# Patient Record
Sex: Female | Born: 1970
Health system: Southern US, Community
[De-identification: ages and names within clinical notes are randomized; demographics above are authoritative.]

## PROBLEM LIST (undated history)

## (undated) DIAGNOSIS — J189 Pneumonia, unspecified organism: Secondary | ICD-10-CM

## (undated) DIAGNOSIS — R32 Unspecified urinary incontinence: Secondary | ICD-10-CM

## (undated) DIAGNOSIS — Z5189 Encounter for other specified aftercare: Secondary | ICD-10-CM

## (undated) DIAGNOSIS — Z9889 Other specified postprocedural states: Secondary | ICD-10-CM

## (undated) DIAGNOSIS — R011 Cardiac murmur, unspecified: Secondary | ICD-10-CM

## (undated) DIAGNOSIS — R87619 Unspecified abnormal cytological findings in specimens from cervix uteri: Secondary | ICD-10-CM

## (undated) DIAGNOSIS — N939 Abnormal uterine and vaginal bleeding, unspecified: Secondary | ICD-10-CM

## (undated) DIAGNOSIS — Z973 Presence of spectacles and contact lenses: Secondary | ICD-10-CM

## (undated) DIAGNOSIS — R112 Nausea with vomiting, unspecified: Secondary | ICD-10-CM

## (undated) DIAGNOSIS — T7840XA Allergy, unspecified, initial encounter: Secondary | ICD-10-CM

## (undated) HISTORY — DX: Cardiac murmur, unspecified: R01.1

## (undated) HISTORY — PX: DILATION AND CURETTAGE OF UTERUS: SHX78

## (undated) HISTORY — DX: Unspecified abnormal cytological findings in specimens from cervix uteri: R87.619

## (undated) HISTORY — DX: Allergy, unspecified, initial encounter: T78.40XA

## (undated) HISTORY — DX: Abnormal uterine and vaginal bleeding, unspecified: N93.9

## (undated) HISTORY — PX: REDUCTION MAMMAPLASTY: SUR839

## (undated) HISTORY — DX: Unspecified urinary incontinence: R32

## (undated) HISTORY — DX: Encounter for other specified aftercare: Z51.89

---

## 1993-08-28 DIAGNOSIS — R87619 Unspecified abnormal cytological findings in specimens from cervix uteri: Secondary | ICD-10-CM

## 1993-08-28 HISTORY — DX: Unspecified abnormal cytological findings in specimens from cervix uteri: R87.619

## 1999-08-08 ENCOUNTER — Other Ambulatory Visit: Admission: RE | Admit: 1999-08-08 | Discharge: 1999-08-08 | Payer: Self-pay | Admitting: Obstetrics and Gynecology

## 1999-10-28 ENCOUNTER — Ambulatory Visit (HOSPITAL_COMMUNITY): Admission: RE | Admit: 1999-10-28 | Discharge: 1999-10-28 | Payer: Self-pay | Admitting: Pulmonary Disease

## 1999-10-28 ENCOUNTER — Encounter: Payer: Self-pay | Admitting: Pulmonary Disease

## 2000-08-11 ENCOUNTER — Emergency Department (HOSPITAL_COMMUNITY): Admission: EM | Admit: 2000-08-11 | Discharge: 2000-08-11 | Payer: Self-pay | Admitting: Emergency Medicine

## 2000-09-05 ENCOUNTER — Ambulatory Visit (HOSPITAL_COMMUNITY): Admission: RE | Admit: 2000-09-05 | Discharge: 2000-09-05 | Payer: Self-pay | Admitting: Obstetrics & Gynecology

## 2000-11-23 ENCOUNTER — Inpatient Hospital Stay (HOSPITAL_COMMUNITY): Admission: AD | Admit: 2000-11-23 | Discharge: 2000-11-25 | Payer: Self-pay | Admitting: Obstetrics and Gynecology

## 2000-12-21 ENCOUNTER — Other Ambulatory Visit: Admission: RE | Admit: 2000-12-21 | Discharge: 2000-12-21 | Payer: Self-pay | Admitting: Obstetrics and Gynecology

## 2001-07-04 ENCOUNTER — Encounter: Payer: Self-pay | Admitting: Pulmonary Disease

## 2001-07-04 ENCOUNTER — Ambulatory Visit (HOSPITAL_COMMUNITY): Admission: RE | Admit: 2001-07-04 | Discharge: 2001-07-04 | Payer: Self-pay | Admitting: Pulmonary Disease

## 2001-12-05 ENCOUNTER — Other Ambulatory Visit: Admission: RE | Admit: 2001-12-05 | Discharge: 2001-12-05 | Payer: Self-pay | Admitting: Obstetrics and Gynecology

## 2004-06-01 ENCOUNTER — Other Ambulatory Visit: Admission: RE | Admit: 2004-06-01 | Discharge: 2004-06-01 | Payer: Self-pay | Admitting: Obstetrics and Gynecology

## 2005-03-30 ENCOUNTER — Ambulatory Visit: Payer: Self-pay | Admitting: Pulmonary Disease

## 2007-08-29 HISTORY — PX: ENDOMETRIAL ABLATION: SHX621

## 2007-12-17 ENCOUNTER — Encounter: Admission: RE | Admit: 2007-12-17 | Discharge: 2008-03-16 | Payer: Self-pay | Admitting: Urology

## 2008-08-06 ENCOUNTER — Ambulatory Visit (HOSPITAL_COMMUNITY): Admission: RE | Admit: 2008-08-06 | Discharge: 2008-08-07 | Payer: Self-pay | Admitting: Obstetrics and Gynecology

## 2008-08-06 ENCOUNTER — Encounter (INDEPENDENT_AMBULATORY_CARE_PROVIDER_SITE_OTHER): Payer: Self-pay | Admitting: Obstetrics and Gynecology

## 2010-09-27 ENCOUNTER — Encounter: Payer: Self-pay | Admitting: Family Medicine

## 2010-11-09 ENCOUNTER — Encounter: Payer: Self-pay | Admitting: Family Medicine

## 2010-11-11 ENCOUNTER — Encounter: Payer: Self-pay | Admitting: Family Medicine

## 2010-11-15 NOTE — Assessment & Plan Note (Signed)
Summary: Urinary Symptoms  Complains of mild recurrent dysuria.  No fever or abdominal pain  Plan:  Repeat culture CIPROFLOXACIN HCL 500 MG TABS (CIPROFLOXACIN HCL) 1 by mouth q12hr 11/09/10 @ 12:00pm called in RX to [facility]. pt notified. C.Locklear,LPN  Prescriptions: CIPROFLOXACIN HCL 500 MG TABS (CIPROFLOXACIN HCL) 1 by mouth q12hr  #20 x 0   Entered and Authorized by:   Donna Christen MD   Signed by:   Donna Christen MD on 11/09/2010   Method used:   Telephoned to ...         RxID:   [id number]

## 2011-01-10 NOTE — Op Note (Signed)
Regina Mcconnell, Regina Mcconnell                  ACCOUNT NO.:  [id number]   MEDICAL RECORD NO.:  [id number]          PATIENT TYPE:  AMB   LOCATION:  SDC                           FACILITY:  [facility]   PHYSICIAN:  Randye Lobo, M.D.   DATE OF BIRTH:  May 16, 1971   DATE OF PROCEDURE:  DATE OF DISCHARGE:                               OPERATIVE REPORT   PREOPERATIVE DIAGNOSES:  1. Genuine stress incontinence.  2. Menometrorrhagia.  3. Incomplete vaginal prolapse.   POSTOPERATIVE DIAGNOSES:  1. Genuine stress incontinence.  2. Menometrorrhagia.  3. Incomplete vaginal prolapse.  4. Duplication of left ureter.   PROCEDURES:  Diagnostic hysteroscopy, dilation and curettage, NovaSure  endometrial ablation, tension-free vaginal tape mid urethral sling,  cystoscopy, anterior colporrhaphy.   SURGEON:  Randye Lobo, MD   ASSISTANT:  Miguel Aschoff, MD   ANESTHESIA:  General endotracheal, paracervical block with 1% lidocaine,  local vaginal injection of 0.5% lidocaine with 1:200,000 of epinephrine.   IV FLUIDS:  1000 mL Ringer's lactate.   ESTIMATED BLOOD LOSS:  75 mL.   URINE OUTPUT:  325 mL   COMPLICATIONS:  None.   INDICATIONS FOR PROCEDURE:  The patient is a 40 year old para 1,  Caucasian female who presents with leakage of urine with coughing,  sneezing, and laughing.  The patient has done physical therapy which has  not adequately improved her urinary incontinence.  The patient is also  having frequent and heavy menses.  She has undergone an office  ultrasound which was normal.  An endometrial biopsy was benign.  The  patient did have a trial of oral contraceptive therapy and continued to  have bleeding with this.  She declines further medical therapy and she  wishes to proceed with an endometrial ablation procedure and a mid  urethral sling and cystoscopy.  On exam, she is noted to have a second-  degree cystocele and this will be repaired as well.  A plan is now made  to proceed with a  hysteroscopy D and C, NovaSure endometrial ablation,  TVT sling, cystoscopy, and anterior colporrhaphy after risks, benefits,  and alternatives are reviewed.   FINDINGS:  The hysteroscopy demonstrated a normal endometrial cavity  with no evidence of any polyps or fibroids.  Both ends of tubal ostia  regions were identified and were noted to be unremarkable.  There was no  lesions within the cervix.   Cystoscopy demonstrated duplication of the ureter on the left-hand side.  There was a single ureter on the right.  All of the ureters were noted  to be patent after the injection of indigo carmine dye IV.  The bladder  was visualized about 360 degrees including the bladder dome and trigone  and there was no evidence of a foreign body nor any lesions in either  the bladder or the urethra.   SPECIMENS:  Endometrial curettings were sent to pathology.   PROCEDURE:  The patient was reidentified in the preoperative holding  area.  She did receive Cipro 400 mg IV for antibiotic prophylaxis.  She  received PAS stockings for DVT prophylaxis.  In the operating room, general endotracheal anesthesia was induced.  The  patient was then placed in the dorsal lithotomy position.  The lower  abdomen and vagina were sterilely prepped and draped and a Foley  catheter was placed inside the bladder.   An exam under anesthesia was performed.   A speculum was placed inside the vagina and a single-tooth tenaculum was  placed on the anterior cervical lip.  A paracervical block was performed  with a total of 10 mL of 1% lidocaine.   The endocervical length was sounded to 2.5 cm.  The cavity length was  then determined to be 8 cm.  The diagnostic hysteroscope was inserted  into the uterine cavity under the continuous infusion of hysteroscopic  fluid.  The findings are as noted above.  The hysteroscope was withdrawn  and the cervix was so dilated up to a #29 Pratt dilator.  The  endometrial cavity was then  curetted in all four regions and the tissue  was sent to pathology.   The NovaSure device was then placed inside the uterine cavity and the  cavity width was measured at 2.7 cm.  The obturator was placed against  the cervix and the CO2 test was performed, and the patient passed.  The  NovaSure device was then used for a power setting of 82 and a total  treat time of 1 minute and 50 seconds.  The device was removed without  difficulty and a single-tooth tenaculum was removed from the anterior  cervical lip.   A weighted speculum was then placed inside the vagina and Allis clamps  were placed along the anterior vaginal mucosa.  The mucosa was then  injected with 0.5% lidocaine with 1:200,000 of epinephrine.  The vaginal  mucosa was incised in the midline with a scalpel and a combination of  sharp and blunt dissection were used to dissect the subvaginal tissue  and bladder off of the overlying vaginal mucosa bilaterally.  The  dissection was carried to the level of the pubic rami and down to the  level of the cervix.  Hemostasis was created with monopolar cautery.   Two 1 cm incisions were then created suprapubically 2 cm to the right  and left to the midline.  The TVT was performed in a top-down fashion.  The abdominal needle passer was placed through the right suprapubic  incision and out through the endopelvic fascia in the vagina on the  ipsilateral side.  The same procedure that was performed on the right-  hand side was repeated on the left-hand side.   The Foley catheter was removed and cystoscopy was performed in the  findings as noted above.  The cystoscopic fluid was drained and the  Foley catheter was replaced.  The sling was attached to the abdominal  needle passers and drawn up through the suprapubic incisions.  Final  tension on the sling was adjusted and the plastic sheaths were then  removed as a Kelly clamp was placed between the sling and the urethra.  Excess sling was  trimmed suprapubically.  The sling was noted to be in  excellent position.   The anterior colporrhaphy was performed next well by placing vertical  mattress sutures of 2-0 Vicryl.  Excess vaginal mucosa was trimmed in  the anterior vaginal wall, was closed with a running lock suture of 2-0  Vicryl.  Hemostasis was good.  The vaginal packing of plain gauze was  placed inside the vagina.   The suprapubic  incisions were closed with Dermabond.   This concluded the patient's procedure.  There were no complications.  All needle, instrument, and sponge counts were correct.      Randye Lobo, M.D.  Electronically Signed     BES/MEDQ  D:  08/06/2008  T:  08/07/2008  Job:  [id number]

## 2011-06-01 LAB — CBC
HCT: 40.8 % (ref 36.0–46.0)
Hemoglobin: 13.6 g/dL (ref 12.0–15.0)
MCHC: 33.3 g/dL (ref 30.0–36.0)
MCHC: 33.8 g/dL (ref 30.0–36.0)
MCV: 84.1 fL (ref 78.0–100.0)
Platelets: 326 10*3/uL (ref 150–400)
RBC: 3.94 MIL/uL (ref 3.87–5.11)
RBC: 4.85 MIL/uL (ref 3.87–5.11)
RDW: 13.6 % (ref 11.5–15.5)
WBC: 8.5 10*3/uL (ref 4.0–10.5)

## 2011-06-01 LAB — BASIC METABOLIC PANEL
BUN: 13 mg/dL (ref 6–23)
CO2: 26 mEq/L (ref 19–32)
CO2: 27 mEq/L (ref 19–32)
Calcium: 8.2 mg/dL — ABNORMAL LOW (ref 8.4–10.5)
Calcium: 9.3 mg/dL (ref 8.4–10.5)
Chloride: 106 mEq/L (ref 96–112)
Creatinine, Ser: 0.7 mg/dL (ref 0.4–1.2)
Creatinine, Ser: 0.77 mg/dL (ref 0.4–1.2)
GFR calc Af Amer: >60 mL/min (ref >60)
GFR calc Af Amer: >60 mL/min (ref >60)
GFR calc non Af Amer: >60 mL/min (ref >60)
Glucose, Bld: 99 mg/dL (ref 70–99)
Potassium: 4.2 mEq/L (ref 3.5–5.1)
Sodium: 139 mEq/L (ref 135–145)

## 2011-06-01 LAB — URINALYSIS, ROUTINE W REFLEX MICROSCOPIC
Hgb urine dipstick: NEGATIVE
Nitrite: NEGATIVE
Protein, ur: NEGATIVE mg/dL
Urobilinogen, UA: 0.2 mg/dL (ref 0.0–1.0)

## 2011-06-01 LAB — PREGNANCY, URINE: Preg Test, Ur: NEGATIVE

## 2012-03-05 ENCOUNTER — Encounter: Payer: Self-pay | Admitting: Physician Assistant

## 2012-03-06 ENCOUNTER — Encounter: Payer: Self-pay | Admitting: Physician Assistant

## 2012-06-03 ENCOUNTER — Encounter: Payer: Self-pay | Admitting: Sports Medicine

## 2012-06-03 ENCOUNTER — Ambulatory Visit (INDEPENDENT_AMBULATORY_CARE_PROVIDER_SITE_OTHER): Payer: 59 | Admitting: Sports Medicine

## 2012-06-03 VITALS — BP 130/82 | HR 77

## 2012-06-03 DIAGNOSIS — M545 Low back pain, unspecified: Secondary | ICD-10-CM | POA: Insufficient documentation

## 2012-06-03 DIAGNOSIS — M5410 Radiculopathy, site unspecified: Secondary | ICD-10-CM | POA: Insufficient documentation

## 2012-06-03 DIAGNOSIS — IMO0002 Reserved for concepts with insufficient information to code with codable children: Secondary | ICD-10-CM

## 2012-06-03 DIAGNOSIS — M519 Unspecified thoracic, thoracolumbar and lumbosacral intervertebral disc disorder: Secondary | ICD-10-CM | POA: Insufficient documentation

## 2012-06-03 MED ORDER — PREDNISONE 50 MG PO TABS: ORAL_TABLET | ORAL | Status: DC

## 2012-06-03 MED ORDER — KETOROLAC TROMETHAMINE 30 MG/ML IJ SOLN
30.0000 mg | Freq: Once | INTRAMUSCULAR | Status: AC
  Administered 2012-06-03: 30 mg via INTRAMUSCULAR

## 2012-06-03 MED ORDER — CYCLOBENZAPRINE HCL 10 MG PO TABS: ORAL_TABLET | ORAL | Status: DC

## 2012-06-03 MED ORDER — KETOROLAC TROMETHAMINE 30 MG/ML IJ SOLN: 30.0000 mg | Freq: Once | INTRAMUSCULAR | Status: DC

## 2012-06-03 MED ORDER — MELOXICAM 15 MG PO TABS: ORAL_TABLET | ORAL | Status: DC

## 2012-06-03 NOTE — Progress Notes (Signed)
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 She comes in with a several hour history of pain that she localizes in her low back, and paralumbar musculature. She remembers bending over this morning to pick up something, and then feeling intense pain in the above location. The pain is now worse with flexion, laying flat, as well as Valsalva. She denies any radicular symptoms, and denies any bowel, or bladder incontinence. She also denies any fevers, chills, night sweats, or weight loss. The pain is sharp in nature, she's been using ice packs.  Past medical history, Surgical history, Family history, Social history, Allergies, and medications have been entered into the medical record, reviewed, and no changes needed.   Review of Systems: No headache, visual changes, nausea, vomiting, diarrhea, constipation, dizziness, abdominal pain, skin rash, fevers, chills, night sweats, weight loss, swollen lymph nodes, body aches, joint swelling, muscle aches, chest pain, or shortness of breath.   Objective:   Vitals:  Afebrile, vital signs stable. General: Well Developed, well nourished, and in no acute distress.  Neuro/Psych: Alert and oriented x3, extra-ocular muscles intact, able to move all 4 extremities.  Skin: Warm and dry, no rashes noted.  Respiratory: Not using accessory muscles, speaking in full sentences, trachea midline.  Cardiovascular: Pulses palpable, no extremity edema. Abdomen: Does not appear distended. Back Exam:  Inspection: Unremarkable  Motion: Flexion 45 deg, Extension 45 deg, Side Bending to 45 deg bilaterally,  Rotation to 45 deg bilaterally  SLR laying: Negative  XSLR laying: Negative  Palpable tenderness: None. FABER: negative. Sensory change: Gross sensation intact to all lumbar and sacral dermatomes.  Reflexes: 2+ at both patellar tendons, 2+ at achilles tendons, Babinski's downgoing.  Strength at  foot  Plantar-flexion: 5/5 Dorsi-flexion: 5/5 Eversion: 5/5 Inversion: 5/5  Leg strength  Quad: 5/5 Hamstring: 5/5 Hip flexor: 5/5 Hip abductors: 5/5  Gait antalgic.   Impression and Recommendations:   This case required medical decision making of moderate complexity.

## 2012-06-03 NOTE — Assessment & Plan Note (Signed)
Toradol 30 mg IM x1. Flexeril at night. Mobic daily. Prednisone burst. Home rehabilitation to be started later this week.

## 2012-06-21 ENCOUNTER — Other Ambulatory Visit: Payer: Self-pay | Admitting: Family Medicine

## 2012-06-21 MED ORDER — CIPROFLOXACIN HCL 250 MG PO TABS: 250.0000 mg | ORAL_TABLET | Freq: Two times a day (BID) | ORAL | Status: DC

## 2012-06-24 ENCOUNTER — Telehealth: Payer: Self-pay | Admitting: Family Medicine

## 2012-06-24 LAB — URINE CULTURE: Colony Count: >=100000

## 2012-06-24 NOTE — ED Notes (Signed)
Pt with dysuria, increased urinary frequency x 2 days.  No fevers or chills.  Feels like previous UTIs in the past.  < 3 UTIs in the past 12 months.  No previous hx/o urinary tract abnormalities.  Pt placed on cipro for treatment.  Urine culture obtained which shows pan sensitive ecoli.   Doree Albee, MD 06/24/12 2014

## 2012-06-25 ENCOUNTER — Ambulatory Visit: Payer: 59 | Admitting: Family Medicine

## 2012-07-16 ENCOUNTER — Ambulatory Visit: Payer: 59 | Admitting: Family Medicine

## 2012-07-23 ENCOUNTER — Encounter: Payer: Self-pay | Admitting: Family Medicine

## 2012-07-23 ENCOUNTER — Ambulatory Visit (INDEPENDENT_AMBULATORY_CARE_PROVIDER_SITE_OTHER): Payer: 59 | Admitting: Family Medicine

## 2012-07-23 VITALS — BP 120/84 | HR 79 | Ht 64.5 in | Wt 224.0 lb

## 2012-07-23 DIAGNOSIS — Z13220 Encounter for screening for lipoid disorders: Secondary | ICD-10-CM

## 2012-07-23 DIAGNOSIS — R011 Cardiac murmur, unspecified: Secondary | ICD-10-CM | POA: Insufficient documentation

## 2012-07-23 DIAGNOSIS — R635 Abnormal weight gain: Secondary | ICD-10-CM

## 2012-07-23 DIAGNOSIS — Z833 Family history of diabetes mellitus: Secondary | ICD-10-CM | POA: Insufficient documentation

## 2012-07-23 DIAGNOSIS — Z8349 Family history of other endocrine, nutritional and metabolic diseases: Secondary | ICD-10-CM

## 2012-07-23 DIAGNOSIS — Z8249 Family history of ischemic heart disease and other diseases of the circulatory system: Secondary | ICD-10-CM | POA: Insufficient documentation

## 2012-07-23 LAB — LIPID PANEL
LDL Cholesterol: 155 mg/dL — ABNORMAL HIGH (ref 0–99)
Triglycerides: 90 mg/dL (ref <150)

## 2012-07-23 LAB — COMPLETE METABOLIC PANEL WITH GFR
ALT: 13 U/L (ref 0–35)
Albumin: 4.6 g/dL (ref 3.5–5.2)
CO2: 26 mEq/L (ref 19–32)
Calcium: 9.5 mg/dL (ref 8.4–10.5)
Chloride: 104 mEq/L (ref 96–112)
Creat: 0.81 mg/dL (ref 0.50–1.10)
GFR, Est African American: >89 mL/min
Sodium: 139 mEq/L (ref 135–145)
Total Protein: 7.3 g/dL (ref 6.0–8.3)

## 2012-07-23 NOTE — Progress Notes (Signed)
Subjective:    Patient ID: Regina Mcconnell, female    DOB: 1970-12-28, 41 y.o.   MRN: [id number]  HPI Here to establish care today. She has no specific problems or complaints. She recently had an episode of acute low back pain but is feeling much better. She has been working out at [employer] daily for an hour. She says she does with her daughter in this helps. She said eventually her goal to lose weight is to get him 260 pounds. She says she's also been trying to eat more healthy and making better choices does not necessarily: No specific diet. She does report that she has a history of a heart murmur. She's also been trying to eat breakfast which she does not normally do. She does have some questions about weight loss medications.   Review of Systems  Constitutional: Negative for fever, diaphoresis and unexpected weight change.  HENT: Negative for hearing loss, rhinorrhea and tinnitus.   Eyes: Negative for visual disturbance.  Respiratory: Negative for cough and wheezing.   Cardiovascular: Negative for chest pain and palpitations.  Gastrointestinal: Negative for nausea, vomiting, diarrhea and blood in stool.  Genitourinary: Negative for vaginal bleeding, vaginal discharge and difficulty urinating.  Musculoskeletal: Negative for myalgias and arthralgias.  Skin: Negative for rash.  Neurological: Negative for headaches.  Hematological: Negative for adenopathy. Does not bruise/bleed easily.  Psychiatric/Behavioral: Negative for sleep disturbance and dysphoric mood. The patient is not nervous/anxious.        BP 120/84  Pulse 79  Ht 5' 4.5" (1.638 m)  Wt 224 lb (101.606 kg)  BMI 37.86 kg/m2    No Known Allergies  History reviewed. No pertinent past medical history.  Past Surgical History  Procedure Date  . Endometrial ablation 2009    History   Social History  . Marital Status: Married    Spouse Name: Fredrik Cove    Number of Children: 1  . Years of Education: Assoc degr   Occupational  History  . Clinic Site Manager [employer]   Social History Main Topics  . Smoking status: Never Smoker   . Smokeless tobacco: Not on file  . Alcohol Use: Yes  . Drug Use: Not on file  . Sexually Active: Yes -- Female partner(s)   Other Topics Concern  . Not on file   Social History Narrative   Working out 1 hour a day.  1-2 caffeine drinks per day.      Family History  Problem Relation Age of Onset  . Diabetes Mother   . Heart disease Father   . Heart disease Paternal Uncle   . Stroke Paternal Grandmother     Outpatient Encounter Prescriptions as of 07/23/2012  Medication Sig Dispense Refill  . cetirizine (ZYRTEC) 10 MG tablet Take 10 mg by mouth as needed.      . [DISCONTINUED] ciprofloxacin (CIPRO) 250 MG tablet Take 1 tablet (250 mg total) by mouth 2 (two) times daily.  6 tablet  0  . [DISCONTINUED] cyclobenzaprine (FLEXERIL) 10 MG tablet One half tab PO qHS, then increase gradually to one tab TID.  30 tablet  0  . [DISCONTINUED] meloxicam (MOBIC) 15 MG tablet One tab PO qAM with breakfast for 2 weeks, then daily prn pain.  30 tablet  3  . [DISCONTINUED] predniSONE (DELTASONE) 50 MG tablet One tab PO daily for 5 days.  5 tablet  0       Objective:   Physical Exam  Constitutional: She is oriented to person,  place, and time. She appears well-developed and well-nourished.  HENT:  Head: Normocephalic and atraumatic.  Cardiovascular: Normal rate, regular rhythm and normal heart sounds.   Pulmonary/Chest: Effort normal and breath sounds normal.  Neurological: She is alert and oriented to person, place, and time.  Skin: Skin is warm and dry.  Psychiatric: She has a normal mood and affect. Her behavior is normal.          Assessment & Plan:  Family history of coronary artery disease and diabetes. Recommend screening lipids. She says they were checked a few years back that were normal at that time.  Abnormal Weight Gain - Discussed options in addition to her exercise  and diet regimen. Discussed phentermine as an adjuntct.  discussed potential side effects of the medicatoin.  Recommend to continue with diet and exercise and if gets frustrated consider medication.  Continue to work on eating breakfast.  Weight loss goal of 160lb.   Vaccines are up to date.    Hx of heart murmur - unable to hear on exam today.

## 2012-07-31 ENCOUNTER — Telehealth: Payer: Self-pay | Admitting: Family Medicine

## 2012-07-31 DIAGNOSIS — L237 Allergic contact dermatitis due to plants, except food: Secondary | ICD-10-CM

## 2012-07-31 MED ORDER — PREDNISONE 50 MG PO TABS: ORAL_TABLET | ORAL | Status: DC

## 2012-07-31 NOTE — ED Notes (Addendum)
Pt with worsening poison ivy rash from last week. Was initially on R forearm.  Now involving LEs.  Was not placed on glucocorticoids at initial exposure.  Minimal itching, but rash is progressing.  Pt rxd prednisone for treatment.   Doree Albee, MD 07/31/12 [id number]  Doree Albee, MD 07/31/12 [phone]

## 2012-09-10 ENCOUNTER — Other Ambulatory Visit: Payer: Self-pay

## 2012-09-10 MED ORDER — VALACYCLOVIR HCL 500 MG PO TABS: 500.0000 mg | ORAL_TABLET | Freq: Two times a day (BID) | ORAL | Status: DC | PRN

## 2012-09-10 NOTE — Telephone Encounter (Signed)
Shavone has a cold sore on her nose for 3 days. She would like an antiviral sent to the pharmacy.

## 2012-09-12 ENCOUNTER — Telehealth: Payer: Self-pay | Admitting: Family Medicine

## 2012-09-12 MED ORDER — AMOXICILLIN-POT CLAVULANATE 875-125 MG PO TABS: 1.0000 | ORAL_TABLET | Freq: Two times a day (BID) | ORAL | Status: DC

## 2012-09-12 NOTE — ED Notes (Signed)
Pt with headache and sinus pressure over the last week.  Had URI sxs last week.  No with frontal headache and sinus pressure.  No fevers chills.  NO LOV.  No hemiparesis or confusion.  No rhinorrhea or nasal congestion.  VSS Gen: up in chair, NAD HEENT: NCAT, EOMI, TMs clear bilaterally, +nasal erythema, rhinorrhea bilaterally, + post oropharyngeal erythema, + maxillary TTP bilaterally, no temporal tenderness.  CV: RRR, no murmurs auscultated PULM: CTAB, no wheezes, rales, rhoncii ABD: S/NT/+ bowel sounds  EXT: 2+ peripheral pulses  Clinically with sinusitis.  Will place on course of augmentin for treatment.  Discussed general care.              Doree Albee, MD 09/12/12 1116

## 2012-11-12 ENCOUNTER — Ambulatory Visit: Payer: 59 | Admitting: Family Medicine

## 2013-01-02 ENCOUNTER — Ambulatory Visit (INDEPENDENT_AMBULATORY_CARE_PROVIDER_SITE_OTHER): Payer: 59 | Admitting: Sports Medicine

## 2013-01-02 DIAGNOSIS — L255 Unspecified contact dermatitis due to plants, except food: Secondary | ICD-10-CM

## 2013-01-02 DIAGNOSIS — L237 Allergic contact dermatitis due to plants, except food: Secondary | ICD-10-CM | POA: Insufficient documentation

## 2013-01-02 MED ORDER — METHYLPREDNISOLONE SODIUM SUCC 125 MG IJ SOLR
125.0000 mg | Freq: Once | INTRAMUSCULAR | Status: AC
  Administered 2013-01-02: 125 mg via INTRAMUSCULAR

## 2013-01-02 MED ORDER — SODIUM CHLORIDE 0.9 % IV SOLN: 125.0000 mg | Freq: Once | INTRAVENOUS | Status: DC

## 2013-01-02 MED ORDER — METHYLPREDNISOLONE ACETATE 80 MG/ML IJ SUSP
80.0000 mg | Freq: Once | INTRAMUSCULAR | Status: AC
  Administered 2013-01-02: 80 mg via INTRAMUSCULAR

## 2013-01-02 NOTE — Assessment & Plan Note (Signed)
Persistent rash on the left leg and buttock in a distribution difficult to cover with a topical agent. Intramuscular Solu-Medrol 125 and Depo-Medrol 80 mg given. Return as needed.

## 2013-01-02 NOTE — Progress Notes (Signed)
  Subjective:    Patient ID: Regina Mcconnell, female    DOB: [birth year], 42 y.o.   MRN: [id number] Poison ivy dermititis on back of LT upper thigh. HPI    Review of Systems     Objective:   Physical Exam        Assessment & Plan:   I was present for all essential parts of this visit and procedure. Ihor Austin. Benjamin Stain, M.D.

## 2013-02-04 ENCOUNTER — Ambulatory Visit (INDEPENDENT_AMBULATORY_CARE_PROVIDER_SITE_OTHER): Payer: 59 | Admitting: Sports Medicine

## 2013-02-04 DIAGNOSIS — L255 Unspecified contact dermatitis due to plants, except food: Secondary | ICD-10-CM

## 2013-02-04 DIAGNOSIS — L237 Allergic contact dermatitis due to plants, except food: Secondary | ICD-10-CM

## 2013-02-04 DIAGNOSIS — L259 Unspecified contact dermatitis, unspecified cause: Secondary | ICD-10-CM

## 2013-02-04 MED ORDER — METHYLPREDNISOLONE ACETATE 40 MG/ML IJ SUSP
40.0000 mg | Freq: Once | INTRAMUSCULAR | Status: AC
  Administered 2013-02-04: 40 mg via INTRAMUSCULAR

## 2013-02-04 MED ORDER — METHYLPREDNISOLONE SODIUM SUCC 125 MG IJ SOLR
125.0000 mg | Freq: Once | INTRAMUSCULAR | Status: AC
  Administered 2013-02-04: 125 mg via INTRAMUSCULAR

## 2013-02-04 NOTE — Progress Notes (Signed)
  Subjective: Regina Mcconnell has gotten into poison ivy again.  Regina Mcconnell now has itching of both her hands and her lower left abdomen.   Objective: General: Well-developed, well-nourished, and in no acute distress. There is an erythematous vesicular rash over both hands, and left lower abdomen.  40 mg of Depo-Medrol 125 mg of Solu-Medrol given intramuscularly today.  Assessment/plan:

## 2013-02-04 NOTE — Assessment & Plan Note (Signed)
Depo-Medrol and Solu-Medrol given today. Return as needed.

## 2014-04-30 ENCOUNTER — Telehealth: Payer: Self-pay | Admitting: Family Medicine

## 2014-04-30 MED ORDER — BETAMETHASONE DIPROPIONATE 0.05 % EX CREA: TOPICAL_CREAM | Freq: Every day | CUTANEOUS | Status: DC

## 2014-04-30 MED ORDER — PREDNISONE 20 MG PO TABS: ORAL_TABLET | ORAL | Status: DC

## 2014-04-30 NOTE — Telephone Encounter (Signed)
Pt still having new lesion from poison ivey.

## 2014-12-01 ENCOUNTER — Other Ambulatory Visit (INDEPENDENT_AMBULATORY_CARE_PROVIDER_SITE_OTHER): Payer: 59 | Admitting: Family Medicine

## 2014-12-01 ENCOUNTER — Other Ambulatory Visit: Payer: Self-pay | Admitting: Family Medicine

## 2014-12-01 DIAGNOSIS — Z131 Encounter for screening for diabetes mellitus: Secondary | ICD-10-CM | POA: Diagnosis not present

## 2014-12-01 LAB — HEMOGLOBIN A1C
HEMOGLOBIN A1C: 6 % — AB (ref <5.7)
MEAN PLASMA GLUCOSE: 126 mg/dL — AB (ref <117)

## 2014-12-01 LAB — POCT GLYCOSYLATED HEMOGLOBIN (HGB A1C): Hemoglobin A1C: 5.7

## 2014-12-25 ENCOUNTER — Other Ambulatory Visit: Payer: Self-pay

## 2014-12-25 MED ORDER — VALACYCLOVIR HCL 500 MG PO TABS: 500.0000 mg | ORAL_TABLET | Freq: Two times a day (BID) | ORAL | Status: AC | PRN

## 2015-01-12 ENCOUNTER — Ambulatory Visit (INDEPENDENT_AMBULATORY_CARE_PROVIDER_SITE_OTHER): Payer: 59 | Admitting: Family Medicine

## 2015-01-12 VITALS — BP 126/75 | HR 68

## 2015-01-12 DIAGNOSIS — L237 Allergic contact dermatitis due to plants, except food: Secondary | ICD-10-CM

## 2015-01-12 MED ORDER — METHYLPREDNISOLONE SODIUM SUCC 125 MG IJ SOLR
125.0000 mg | Freq: Once | INTRAMUSCULAR | Status: AC
  Administered 2015-01-12: 125 mg via INTRAMUSCULAR

## 2015-01-12 NOTE — Progress Notes (Signed)
   Subjective:    Patient ID: Regina Mcconnell, female    DOB: 01/30/1971, 44 y.o.   MRN: [id number]  HPI  [name] complains of rash on arms and neck for 1 day. She did work out in the yard and think exposed to poison ivy. Denies shortness of breath or mouth swelling. Says gets this every summer.    Review of Systems     Objective:   Physical Exam        Assessment & Plan:  Dermatitis due to poison ivy - Solu Medrol 125 mg given - return if symptoms persist or worsen. She wants to use Rx topical steroid instead of exteneded prednisone.  Beatrice Lecher, MD

## 2015-06-03 ENCOUNTER — Other Ambulatory Visit: Payer: Self-pay | Admitting: Family Medicine

## 2015-06-03 DIAGNOSIS — Z1239 Encounter for other screening for malignant neoplasm of breast: Secondary | ICD-10-CM

## 2015-06-28 ENCOUNTER — Telehealth: Payer: Self-pay | Admitting: Obstetrics and Gynecology

## 2015-06-28 NOTE — Telephone Encounter (Signed)
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 Quincy Simmonds for on 08/06/15.

## 2015-07-03 ENCOUNTER — Other Ambulatory Visit: Payer: Self-pay

## 2015-07-03 DIAGNOSIS — Z1231 Encounter for screening mammogram for malignant neoplasm of breast: Secondary | ICD-10-CM

## 2015-08-06 ENCOUNTER — Encounter: Payer: Self-pay | Admitting: Obstetrics and Gynecology

## 2015-08-06 ENCOUNTER — Ambulatory Visit (INDEPENDENT_AMBULATORY_CARE_PROVIDER_SITE_OTHER): Payer: 59 | Admitting: Obstetrics and Gynecology

## 2015-08-06 VITALS — BP 130/84 | HR 70 | Resp 16 | Ht 64.0 in | Wt 236.2 lb

## 2015-08-06 DIAGNOSIS — N393 Stress incontinence (female) (male): Secondary | ICD-10-CM

## 2015-08-06 DIAGNOSIS — Z01419 Encounter for gynecological examination (general) (routine) without abnormal findings: Secondary | ICD-10-CM

## 2015-08-06 DIAGNOSIS — N812 Incomplete uterovaginal prolapse: Secondary | ICD-10-CM

## 2015-08-06 LAB — POCT URINALYSIS DIPSTICK
BILIRUBIN UA: NEGATIVE
Blood, UA: NEGATIVE
Glucose, UA: NEGATIVE
KETONES UA: NEGATIVE
LEUKOCYTES UA: NEGATIVE
NITRITE UA: NEGATIVE
PH UA: 5
PROTEIN UA: NEGATIVE
UROBILINOGEN UA: NEGATIVE

## 2015-08-06 NOTE — Progress Notes (Signed)
Patient ID: Regina Mcconnell, female   DOB: 06/27/1971, 44 y.o.   MRN: [id number] GYNECOLOGY  VISIT   HPI: 44 y.o.   Married  Caucasian  female   G1P1001 with Patient's last menstrual period was 08/28/2008.   here for annual exam.  Notes stress urinary incontinence.    Hx endometrial ablation and midurethral sling? By me in 2009.  Having incontinence of urine with squatting or running.  Feels pressure to void.  Voiding often.   Drinks a lot of water and very little caffeine in the form of tea. Has gained weight.   PT helped a little bit but not a lot.  Status she has gained weight.   Some hot flashes.   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  Daughter is now 36 years old.   PCP - Beatrice Lecher.   GYNECOLOGIC HISTORY: Patient's last menstrual period was 08/28/2008.   Had an episode of spotting three years ago. Contraception:None/Ablation 2009 Menopausal hormone therapy: n/a Last mammogram: 06/2015 normal per patient--done at [facility].  States a friend works there and does it for her.  Last pap smear: 2013 normal(hx colposcopy/cryotherapy in 1995,paps normal since)        OB History    Gravida Para Term Preterm AB TAB SAB Ectopic Multiple Living   1 1 1       1          Patient Active Problem List   Diagnosis Date Noted  . Poison ivy dermatitis 01/02/2013  . Family history of early CAD 07/23/2012  . Family history of diabetes mellitus 07/23/2012  . Heart murmur 07/23/2012  . Acute low back pain with disc symptoms, duration less than 6 weeks 06/03/2012    Past Medical History  Diagnosis Date  . Heart murmur   . Abnormal Pap smear of cervix 1995    --hx colposcopy w/bx and cryotherapy of cervix(paps normal since)  . Abnormal uterine bleeding     reason for ablation in 2009  . Urinary incontinence     especially with exercise    Past Surgical History  Procedure Laterality Date  . Endometrial ablation  2009    Dr. Quincy Simmonds  .  Dilation and curettage of uterus      due to heavy cycles    No current outpatient prescriptions on file.   No current facility-administered medications for this visit.     ALLERGIES: Review of patient's allergies indicates no known allergies.  Family History  Problem Relation Age of Onset  . Diabetes Mother   . Thyroid disease Mother   . Heart disease Father   . Heart disease Paternal Uncle     all deceased from heart attacks  . Stroke Paternal Grandmother     Social History   Social History  . Marital Status: Married    Spouse Name: Francee Piccolo  . Number of Children: 1  . Years of Education: Assoc degr   Occupational History  . Clinic Site [place]   Social History Main Topics  . Smoking status: Never Smoker   . Smokeless tobacco: Not on file  . Alcohol Use: 0.0 oz/week    0 Standard drinks or equivalent per week     Comment: once or twice a year  . Drug Use: No  . Sexual Activity:    Partners: Male    Birth Control/ Protection: None   Other Topics Concern  . Not on file   Social History Narrative  Working out 1 hour a day.  1-2 caffeine drinks per day.      ROS:  Pertinent items are noted in HPI.  PHYSICAL EXAMINATION:    BP 130/84 mmHg  Pulse 70  Resp 16  Ht 5\' 4"  (1.626 m)  Wt 236 lb 3.2 oz (107.14 kg)  BMI 40.52 kg/m2  LMP 08/28/2008    General appearance: alert, cooperative and appears stated age Head: Normocephalic, without obvious abnormality, atraumatic Neck: no adenopathy, supple, symmetrical, trachea midline and thyroid normal to inspection and palpation Lungs: clear to auscultation bilaterally Breasts: No nipple retraction or dimpling, No nipple discharge or bleeding, No axillary or supraclavicular adenopathy, Normal to palpation without dominant masses, bilateral breasts with follicular inflammation.  Heart: regular rate and rhythm Abdomen: soft, non-tender; bowel sounds normal; no masses,  no organomegaly Extremities:  extremities normal, atraumatic, no cyanosis or edema Skin: Skin color, texture, turgor normal. No rashes or lesions Lymph nodes: Cervical, supraclavicular, and axillary nodes normal. No abnormal inguinal nodes palpated Neurologic: Grossly normal  Pelvic: External genitalia:  no lesions.  Bilateral suprapubic incisions.               Urethra:  normal appearing urethra with no masses, tenderness or lesions              Bartholins and Skenes: normal                 Vagina: normal appearing vagina with normal color and discharge, no lesions.  Second to third degree cystocele, first to second degree uterine prolapse, first to second degree rectocele.               Cervix: no lesions              Pap taken: Yes.   Bimanual Exam:  Uterus:  normal size, contour, position, consistency, mobility, non-tender              Adnexa: normal adnexa and no mass, fullness, tenderness              Rectovaginal: Yes.  .  Confirms.              Anus:  normal sphincter tone, no lesions  Chaperone was present for exam.  ASSESSMENT  Well woman visit.  Incomplete uterovaginal prolapse.  Status post endometrial ablation and likely midurethral sling.  Remote hx of abnormal pap.  PLAN   Yearly mammogram recommended.  Will need to get report from [place] for 2016.  Routine labs with PCP.  Pap and HR HPV done.  Weight loss encouraged.  Discussion of uterovaginal prolapse and genuine stress incontinence.  Risk factors for recurrence discussed - prior prolapse and incontinence, weight gain, chronic straining, chronic cough.  Options for care include a pessary, temporary use of Impressa, further surgery which would include a hysterectomy with anterior and posterior colporrhaphy, and potential repeat midurethral sling.  Patient will return for a pessary fitting to give some immediate relief of prolapse.  May consider an incontinence dish.  ACOG handouts on prolapse and incontinence and surgical care  of each of these. Patient will get a copy of her OP report sent to me by FAX as this is not available in the [facility] record.  Return for annual exam also.   An After Visit Summary was printed and given to the patient.  An additional __15____ minutes face to face time of which over 50% was spent in counseling regarding prolapse and incontinence.

## 2015-08-06 NOTE — Patient Instructions (Addendum)

## 2015-08-10 ENCOUNTER — Telehealth: Payer: Self-pay | Admitting: Emergency Medicine

## 2015-08-10 LAB — IPS PAP TEST WITH HPV

## 2015-08-10 NOTE — Telephone Encounter (Addendum)
-----   Message from Nunzio Cobbs, MD sent at 08/08/2015  6:43 PM EST ----- Regarding: We need a copy of her mammogram from [place] Please contact patient to request a copy of her mammogram from [facility].   This will need to be scanned in to her EPIC record for [place] and for Meaningful Use.   Thanks!  [place]

## 2015-08-10 NOTE — Telephone Encounter (Signed)
[facility] and requested copy of mammogram. They will fax report to our office.

## 2015-08-27 ENCOUNTER — Encounter: Payer: Self-pay | Admitting: Physician Assistant

## 2015-08-27 ENCOUNTER — Ambulatory Visit (INDEPENDENT_AMBULATORY_CARE_PROVIDER_SITE_OTHER): Payer: 59 | Admitting: Physician Assistant

## 2015-08-27 MED ORDER — PHENTERMINE HCL 37.5 MG PO TABS: 37.5000 mg | ORAL_TABLET | Freq: Every day | ORAL | Status: DC

## 2015-08-27 NOTE — Progress Notes (Signed)
   Subjective:    Patient ID: Regina Mcconnell, female    DOB: 06/04/1971, 44 y.o.   MRN: [id number]  HPI Pt is a 44 yo female who presents to the clinic for weight loss. She is the heaviest she has ever been. She wants to start the new year off right. Currently not exercising but she has joined the gym and has a friend that is going with her. She plans on keeping a 1200 to 1500 calorie diet. She has never tried weight loss medications.    Review of Systems  All other systems reviewed and are negative.      Objective:   Physical Exam  Constitutional: She appears well-developed and well-nourished.  Obesity.   HENT:  Head: Normocephalic and atraumatic.  Cardiovascular: Normal rate, regular rhythm and normal heart sounds.   Hx of heart murmur I did not hear any heart murmur today.   Pulmonary/Chest: Effort normal and breath sounds normal.  Psychiatric: She has a normal mood and affect. Her behavior is normal.          Assessment & Plan:  Morbid obesity- discussed options. Started phentermine. Start 1/2 tablet for 7 days then increase to 1 full tablet. Discussed calorie and diet. Encouraged exercise at least 3-4 times a week. Discussed side effects. Follow up in 1 month for nurse visits. Pt has heart murmur in hx. No problems and found at 23. Saw dr. Stanford Breed. Full release. Follow up if having SOB or palpitations.

## 2015-08-31 MED FILL — PHENTERMINE 37.5 MG TABLET: 37.5 | 30 days supply | Qty: 30 | Fill #0

## 2015-09-01 ENCOUNTER — Ambulatory Visit: Payer: 59 | Admitting: Obstetrics and Gynecology

## 2015-09-10 NOTE — Telephone Encounter (Signed)
Mammogram is scanned in Epic. Will close encounter.

## 2015-09-13 ENCOUNTER — Ambulatory Visit (INDEPENDENT_AMBULATORY_CARE_PROVIDER_SITE_OTHER): Payer: 59 | Admitting: Obstetrics and Gynecology

## 2015-09-13 ENCOUNTER — Encounter: Payer: Self-pay | Admitting: Obstetrics and Gynecology

## 2015-09-13 VITALS — BP 110/80 | HR 76 | Ht 64.0 in | Wt 229.2 lb

## 2015-09-13 DIAGNOSIS — L739 Follicular disorder, unspecified: Secondary | ICD-10-CM | POA: Diagnosis not present

## 2015-09-13 DIAGNOSIS — N812 Incomplete uterovaginal prolapse: Secondary | ICD-10-CM

## 2015-09-13 MED ORDER — CLINDAMYCIN PHOSPHATE 1 % EX LOTN: TOPICAL_LOTION | Freq: Two times a day (BID) | CUTANEOUS | Status: DC

## 2015-09-13 MED FILL — CLINDAMYCIN PHOSP 1% LOTION: 1 | 30 days supply | Qty: 60 | Fill #0

## 2015-09-13 NOTE — Progress Notes (Signed)
Patient ID: Regina Mcconnell, female   DOB: [birth year], 45 y.o.   MRN: [id number] GYNECOLOGY  VISIT   HPI: 45 y.o.   Married  Caucasian  female   G1P1001 with No LMP recorded. Patient has had an ablation.   here for pessary fitting.  Hx endometrial ablation and midurethral sling? By me in 2009.  Second to third degree cystocele, first to second degree uterine prolapse, first to second degree rectocele.  Having incontinence of urine with squatting or running.   Has folliculitis of breast which is untreated.  Noted at annual exam.  States she has a history of picking at it.  Has never seen dermatology.   GYNECOLOGIC HISTORY: No LMP recorded. Patient has had an ablation. Contraception:None/Ablation 2009 Menopausal hormone therapy: none Last mammogram: 06/2015 normal per patient--done at [facility].  States a friend works there and does it for her. Last pap smear: 2013 normal(hx colposcopy/cryotherapy in 1995, paps normal since)        OB History    Gravida Para Term Preterm AB TAB SAB Ectopic Multiple Living   1 1 1       1          Patient Active Problem List   Diagnosis Date Noted  . Poison ivy dermatitis 01/02/2013  . Family history of early CAD 07/23/2012  . Family history of diabetes mellitus 07/23/2012  . Heart murmur 07/23/2012  . Acute low back pain with disc symptoms, duration less than 6 weeks 06/03/2012    Past Medical History  Diagnosis Date  . Heart murmur   . Abnormal Pap smear of cervix 1995    --hx colposcopy w/bx and cryotherapy of cervix(paps normal since)  . Abnormal uterine bleeding     reason for ablation in 2009  . Urinary incontinence     especially with exercise    Past Surgical History  Procedure Laterality Date  . Endometrial ablation  2009    Dr. Quincy Simmonds  . Dilation and curettage of uterus      due to heavy cycles    Current Outpatient Prescriptions  Medication Sig Dispense Refill  . phentermine (ADIPEX-P) 37.5 MG tablet Take  1 tablet (37.5 mg total) by mouth daily before breakfast. 30 tablet 0   No current facility-administered medications for this visit.     ALLERGIES: Review of patient's allergies indicates no known allergies.  Family History  Problem Relation Age of Onset  . Diabetes Mother   . Thyroid disease Mother   . Heart disease Father   . Heart disease Paternal Uncle     all deceased from heart attacks  . Stroke Paternal Grandmother     Social History   Social History  . Marital Status: Married    Spouse Name: Francee Piccolo  . Number of Children: 1  . Years of Education: Assoc degr   Occupational History  . Clinic Site [employer]   Social History Main Topics  . Smoking status: Never Smoker   . Smokeless tobacco: Not on file  . Alcohol Use: 0.0 oz/week    0 Standard drinks or equivalent per week     Comment: once or twice a year  . Drug Use: No  . Sexual Activity:    Partners: Male    Birth Control/ Protection: None   Other Topics Concern  . Not on file   Social History Narrative   Working out 1 hour a day.  1-2 caffeine drinks per day.  ROS:  Pertinent items are noted in HPI.  PHYSICAL EXAMINATION:    BP 110/80 mmHg  Pulse 76  Ht 5\' 4"  (1.626 m)  Wt 229 lb 3.2 oz (103.964 kg)  BMI 39.32 kg/m2    General appearance: alert, cooperative and appears stated age   Pelvic: External genitalia:  no lesions              Urethra:  normal appearing urethra with no masses, tenderness or lesions              Bartholins and Skenes: normal                 Bimanual Exam:  Uterus:  normal size, contour, position, consistency, mobility, non-tender              Adnexa: no mass, fullness, tenderness                 Pessary fitting - #4 incontinence dish and #5 ring with support comfortable and patient able to maintain the pessary with maneuvers in the exam room - jumping, stretching, sitting, squatting, etc.  She prefers the incontinence dish.   Chaperone was present for  exam.  ASSESSMENT  Status post midurethral sling - likely.  OP report to confirm.  Stress incontinence.  Incomplete uterovaginal prolapse.  Folliculitis of breast.    PLAN  Counseled regarding  Pessary use - risks and benefits.  Pessary fitted and will order.  Get OP report from midurethral sling at [facility] for folliculitis of breast.  If does not improve, to dermatology.    An After Visit Summary was printed and given to the patient.  __15____ minutes face to face time of which over 50% was spent in counseling.

## 2015-09-27 ENCOUNTER — Ambulatory Visit: Payer: 59

## 2015-09-27 ENCOUNTER — Ambulatory Visit (INDEPENDENT_AMBULATORY_CARE_PROVIDER_SITE_OTHER): Payer: 59 | Admitting: Obstetrics and Gynecology

## 2015-09-27 ENCOUNTER — Encounter: Payer: Self-pay | Admitting: Obstetrics and Gynecology

## 2015-09-27 VITALS — BP 118/74 | HR 80 | Ht 64.0 in | Wt 232.4 lb

## 2015-09-27 DIAGNOSIS — N812 Incomplete uterovaginal prolapse: Secondary | ICD-10-CM | POA: Diagnosis not present

## 2015-09-27 NOTE — Progress Notes (Signed)
Patient ID: Regina Mcconnell, female   DOB: 03-05-1971, 45 y.o.   MRN: [id number] GYNECOLOGY  VISIT   HPI: 45 y.o.   Married  Caucasian  female   G1P1001 with No LMP recorded. Patient has had an ablation.   here for pessary insertion.   Second to third degree cystocele, first to second degree uterine prolapse, first to second degree rectocele.  Having incontinence of urine with squatting or running.   #4 incontinence dish ordered for the patient.   GYNECOLOGIC HISTORY: No LMP recorded. Patient has had an ablation. Contraception:None Menopausal hormone therapy: None Last mammogram: 06-23-15 Density Cat.B/BiRads--see Epic(done at [facility] Last pap smear: 08-06-15 Neg:Neg HR HPV        OB History    Gravida Para Term Preterm AB TAB SAB Ectopic Multiple Living   1 1 1       1          Patient Active Problem List   Diagnosis Date Noted  . Poison ivy dermatitis 01/02/2013  . Family history of early CAD 07/23/2012  . Family history of diabetes mellitus 07/23/2012  . Heart murmur 07/23/2012  . Acute low back pain with disc symptoms, duration less than 6 weeks 06/03/2012    Past Medical History  Diagnosis Date  . Heart murmur   . Abnormal Pap smear of cervix 1995    --hx colposcopy w/bx and cryotherapy of cervix(paps normal since)  . Abnormal uterine bleeding     reason for ablation in 2009  . Urinary incontinence     especially with exercise    Past Surgical History  Procedure Laterality Date  . Endometrial ablation  2009    Dr. Quincy Simmonds  . Dilation and curettage of uterus      due to heavy cycles    Current Outpatient Prescriptions  Medication Sig Dispense Refill  . clindamycin (CLEOCIN-T) 1 % lotion Apply topically 2 (two) times daily. 60 mL 1  . phentermine (ADIPEX-P) 37.5 MG tablet Take 1 tablet (37.5 mg total) by mouth daily before breakfast. 30 tablet 0   No current facility-administered medications for this visit.     ALLERGIES: Review of patient's  allergies indicates no known allergies.  Family History  Problem Relation Age of Onset  . Diabetes Mother   . Thyroid disease Mother   . Heart disease Father   . Heart disease Paternal Uncle     all deceased from heart attacks  . Stroke Paternal Grandmother     Social History   Social History  . Marital Status: Married    Spouse Name: Francee Piccolo  . Number of Children: 1  . Years of Education: Assoc degr   Occupational History  . Clinic Site [employer]   Social History Main Topics  . Smoking status: Never Smoker   . Smokeless tobacco: Not on file  . Alcohol Use: 0.0 oz/week    0 Standard drinks or equivalent per week     Comment: once or twice a year  . Drug Use: No  . Sexual Activity:    Partners: Male    Birth Control/ Protection: None   Other Topics Concern  . Not on file   Social History Narrative   Working out 1 hour a day.  1-2 caffeine drinks per day.      ROS:  Pertinent items are noted in HPI.  PHYSICAL EXAMINATION:    BP 118/74 mmHg  Pulse 80  Ht 5\' 4"  (1.626 m)  Wt 232  lb 6.4 oz (105.416 kg)  BMI 39.87 kg/m2    General appearance: alert, cooperative and appears stated age   Pelvic: External genitalia:  no lesions            Pessary #4 incontinence dish fitted and comfortable.  Patient able to place, remove, and do maneuvers without difficulty.   Pessary maintained and comfortable.                Chaperone was present for exam.  ASSESSMENT  Incomplete uterovaginal prolapse. Pessary fitted.  PLAN  Miltex Integra #4 incontinence dish to patient.  Lot [id number]. Patient able to void.  Instructed in use, and precautions given.  Use KY jelly with insertion and removal.  Follow up in 2 weeks and prn.  An After Visit Summary was printed and given to the patient.  ___15___ minutes face to face time of which over 50% was spent in counseling.

## 2015-09-30 ENCOUNTER — Ambulatory Visit (INDEPENDENT_AMBULATORY_CARE_PROVIDER_SITE_OTHER): Payer: 59 | Admitting: Family Medicine

## 2015-09-30 VITALS — BP 121/90 | HR 72 | Wt 222.0 lb

## 2015-09-30 DIAGNOSIS — R635 Abnormal weight gain: Secondary | ICD-10-CM | POA: Diagnosis not present

## 2015-09-30 MED ORDER — PHENTERMINE HCL 37.5 MG PO TABS: 37.5000 mg | ORAL_TABLET | Freq: Every day | ORAL | Status: DC

## 2015-09-30 NOTE — Progress Notes (Signed)
   Subjective:    Patient ID: Regina Mcconnell, female    DOB: 25-Dec-1970, 45 y.o.   MRN: [id number]  HPI  JAIDY POFAHL is here for blood pressure and weight check. Diet and exercise is going well. Denies trouble sleeping or palpitations.    Review of Systems     Objective:   Physical Exam        Assessment & Plan:  Abnormal weight gain - Patient has lost weight. A refill of phentermine will be sent to [facility]

## 2015-09-30 NOTE — Progress Notes (Signed)
agreed

## 2015-10-06 MED FILL — PHENTERMINE 37.5 MG TABLET: 37.5 | 30 days supply | Qty: 30 | Fill #0

## 2015-10-27 ENCOUNTER — Ambulatory Visit (INDEPENDENT_AMBULATORY_CARE_PROVIDER_SITE_OTHER): Payer: 59 | Admitting: Physician Assistant

## 2015-10-27 ENCOUNTER — Encounter: Payer: Self-pay | Admitting: Physician Assistant

## 2015-10-27 VITALS — BP 122/69 | HR 64 | Ht 64.0 in | Wt 220.0 lb

## 2015-10-27 DIAGNOSIS — R69 Illness, unspecified: Secondary | ICD-10-CM

## 2015-10-27 DIAGNOSIS — J069 Acute upper respiratory infection, unspecified: Secondary | ICD-10-CM

## 2015-10-27 DIAGNOSIS — J111 Influenza due to unidentified influenza virus with other respiratory manifestations: Secondary | ICD-10-CM

## 2015-10-27 MED ORDER — PSEUDOEPHEDRINE-CODEINE-GG 30-10-100 MG/5ML PO SOLN: 10.0000 mL | Freq: Four times a day (QID) | ORAL | Status: DC | PRN

## 2015-10-27 MED ORDER — AMOXICILLIN-POT CLAVULANATE 875-125 MG PO TABS: 1.0000 | ORAL_TABLET | Freq: Two times a day (BID) | ORAL | Status: DC

## 2015-10-27 NOTE — Progress Notes (Signed)
   Subjective:    Patient ID: Regina Mcconnell, female    DOB: 07-21-1971, 45 y.o.   MRN: [id number]  HPI  Pt presents to the clinic with malaise, cough, dizziness, body aches, fever. Symptoms started Friday with fever and body aches. She did have some episodes of vomiting as well. Highest temp was 102. She was not able to get out of bed until Sunday. 5 days later everything has resolved except for fatigue, cough, and nausea. She did take some tylenol cold and sinus. Cough is dry.    Review of Systems  All other systems reviewed and are negative.      Objective:   Physical Exam  Constitutional: She is oriented to person, place, and time. She appears well-developed and well-nourished.  HENT:  Head: Normocephalic and atraumatic.  Right Ear: External ear normal.  Left Ear: External ear normal.  Nose: Nose normal.  Mouth/Throat: Oropharynx is clear and moist. No oropharyngeal exudate.  TM's clear.   Negative for any sinus pressure or tenderness to palpation.   Eyes: Conjunctivae are normal. Right eye exhibits no discharge. Left eye exhibits no discharge.  Neck: Normal range of motion. Neck supple.  Cardiovascular: Normal rate, regular rhythm and normal heart sounds.   Pulmonary/Chest: Effort normal and breath sounds normal. She has no wheezes.  Lymphadenopathy:    She has no cervical adenopathy.  Neurological: She is alert and oriented to person, place, and time.  Psychiatric: She has a normal mood and affect. Her behavior is normal.          Assessment & Plan:  Influenza like illness- appears to be resolving. Continue to stay hydrated. Rest. Reassured patient that energy will take some time to come back.   Acute upper respiratory infection- lingering from viral infection. Given cheratussin for cough, mucinex for congestion to help lossen up congestion, sudafed to help dry up PND. If symptoms continue for next 2 days or symptoms worsen consider augmentin for 10 days.

## 2015-10-27 NOTE — Patient Instructions (Signed)
Upper Respiratory Infection, Adult Most upper respiratory infections (URIs) are a viral infection of the air passages leading to the lungs. A URI affects the nose, throat, and upper air passages. The most common type of URI is nasopharyngitis and is typically referred to as "the common cold." URIs run their course and usually go away on their own. Most of the time, a URI does not require medical attention, but sometimes a bacterial infection in the upper airways can follow a viral infection. This is called a secondary infection. Sinus and middle ear infections are common types of secondary upper respiratory infections. Bacterial pneumonia can also complicate a URI. A URI can worsen asthma and chronic obstructive pulmonary disease (COPD). Sometimes, these complications can require emergency medical care and may be life threatening.  CAUSES Almost all URIs are caused by viruses. A virus is a type of germ and can spread from one person to another.  RISKS FACTORS You may be at risk for a URI if:   You smoke.   You have chronic heart or lung disease.  You have a weakened defense (immune) system.   You are very young or very old.   You have nasal allergies or asthma.  You work in crowded or poorly ventilated areas.  You work in health care facilities or schools. SIGNS AND SYMPTOMS  Symptoms typically develop 2-3 days after you come in contact with a cold virus. Most viral URIs last 7-10 days. However, viral URIs from the influenza virus (flu virus) can last 14-18 days and are typically more severe. Symptoms may include:   Runny or stuffy (congested) nose.   Sneezing.   Cough.   Sore throat.   Headache.   Fatigue.   Fever.   Loss of appetite.   Pain in your forehead, behind your eyes, and over your cheekbones (sinus pain).  Muscle aches.  DIAGNOSIS  Your health care provider may diagnose a URI by:  Physical exam.  Tests to check that your symptoms are not due to  another condition such as:  Strep throat.  Sinusitis.  Pneumonia.  Asthma. TREATMENT  A URI goes away on its own with time. It cannot be cured with medicines, but medicines may be prescribed or recommended to relieve symptoms. Medicines may help:  Reduce your fever.  Reduce your cough.  Relieve nasal congestion. HOME CARE INSTRUCTIONS   Take medicines only as directed by your health care provider.   Gargle warm saltwater or take cough drops to comfort your throat as directed by your health care provider.  Use a warm mist humidifier or inhale steam from a shower to increase air moisture. This may make it easier to breathe.  Drink enough fluid to keep your urine clear or pale yellow.   Eat soups and other clear broths and maintain good nutrition.   Rest as needed.   Return to work when your temperature has returned to normal or as your health care provider advises. You may need to stay home longer to avoid infecting others. You can also use a face mask and careful hand washing to prevent spread of the virus.  Increase the usage of your inhaler if you have asthma.   Do not use any tobacco products, including cigarettes, chewing tobacco, or electronic cigarettes. If you need help quitting, ask your health care provider. PREVENTION  The best way to protect yourself from getting a cold is to practice good hygiene.   Avoid oral or hand contact with people with cold   symptoms.   Wash your hands often if contact occurs.  There is no clear evidence that vitamin C, vitamin E, echinacea, or exercise reduces the chance of developing a cold. However, it is always recommended to get plenty of rest, exercise, and practice good nutrition.  SEEK MEDICAL CARE IF:   You are getting worse rather than better.   Your symptoms are not controlled by medicine.   You have chills.  You have worsening shortness of breath.  You have brown or red mucus.  You have yellow or brown nasal  discharge.  You have pain in your face, especially when you bend forward.  You have a fever.  You have swollen neck glands.  You have pain while swallowing.  You have white areas in the back of your throat. SEEK IMMEDIATE MEDICAL CARE IF:   You have severe or persistent:  Headache.  Ear pain.  Sinus pain.  Chest pain.  You have chronic lung disease and any of the following:  Wheezing.  Prolonged cough.  Coughing up blood.  A change in your usual mucus.  You have a stiff neck.  You have changes in your:  Vision.  Hearing.  Thinking.  Mood. MAKE SURE YOU:   Understand these instructions.  Will watch your condition.  Will get help right away if you are not doing well or get worse.   This information is not intended to replace advice given to you by your health care provider. Make sure you discuss any questions you have with your health care provider.   Document Released: 02/07/2001 Document Revised: 12/29/2014 Document Reviewed: 11/19/2013 Elsevier Interactive Patient Education 2016 Elsevier Inc.  

## 2015-11-22 ENCOUNTER — Ambulatory Visit: Payer: 59 | Admitting: Family Medicine

## 2015-12-03 ENCOUNTER — Emergency Department: Admission: EM | Admit: 2015-12-03 | Discharge: 2015-12-03 | Payer: Self-pay

## 2016-01-25 ENCOUNTER — Encounter: Payer: Self-pay | Admitting: Family Medicine

## 2016-01-25 ENCOUNTER — Ambulatory Visit (INDEPENDENT_AMBULATORY_CARE_PROVIDER_SITE_OTHER): Payer: 59 | Admitting: Family Medicine

## 2016-01-25 DIAGNOSIS — L237 Allergic contact dermatitis due to plants, except food: Secondary | ICD-10-CM

## 2016-01-25 MED ORDER — PREDNISONE 50 MG PO TABS
50.0000 mg | ORAL_TABLET | Freq: Every day | ORAL | Status: DC
Start: 2016-01-25 — End: 2016-01-31

## 2016-01-25 MED ORDER — METHYLPREDNISOLONE SODIUM SUCC 125 MG IJ SOLR
125.0000 mg | Freq: Once | INTRAMUSCULAR | Status: AC
  Administered 2016-01-25: 125 mg via INTRAMUSCULAR

## 2016-01-25 MED ORDER — TRIAMCINOLONE ACETONIDE 0.1 % EX CREA: 1.0000 "application " | TOPICAL_CREAM | Freq: Two times a day (BID) | CUTANEOUS | Status: DC

## 2016-01-25 MED FILL — predniSONE 50 MG TABS: 50 | 5 days supply | Qty: 5 | Fill #0

## 2016-01-25 MED FILL — TRIAMCINOLONE 0.1% CREAM: 0.1 | 30 days supply | Qty: 454 | Fill #0

## 2016-01-25 NOTE — Assessment & Plan Note (Signed)
Solumedrol 125mg  IM today in the office.  TAC 0.1% RF and oral pred 5d.  Cont topical ivarest spray prn.

## 2016-01-25 NOTE — Patient Instructions (Signed)
Use cream/ spray prn.  Take oral prednisone.  RTC/ call if sx NI or W  Poison [employer] ivy is a inflammation of the skin (contact dermatitis) caused by touching the allergens on the leaves of the ivy plant following previous exposure to the plant. The rash usually appears 48 hours after exposure. The rash is usually bumps (papules) or blisters (vesicles) in a linear pattern. Depending on your own sensitivity, the rash may simply cause redness and itching, or it may also progress to blisters which may break open. These must be well cared for to prevent secondary bacterial (germ) infection, followed by scarring. Keep any open areas dry, clean, dressed, and covered with an antibacterial ointment if needed. The eyes may also get puffy. The puffiness is worst in the morning and gets better as the day progresses. This dermatitis usually heals without scarring, within 2 to 3 weeks without treatment. HOME CARE INSTRUCTIONS  Thoroughly wash with soap and water as soon as you have been exposed to poison ivy. You have about one half hour to remove the plant resin before it will cause the rash. This washing will destroy the oil or antigen on the skin that is causing, or will cause, the rash. Be sure to wash under your fingernails as any plant resin there will continue to spread the rash. Do not rub skin vigorously when washing affected area. Poison ivy cannot spread if no oil from the plant remains on your body. A rash that has progressed to weeping sores will not spread the rash unless you have not washed thoroughly. It is also important to wash any clothes you have been wearing as these may carry active allergens. The rash will return if you wear the unwashed clothing, even several days later. Avoidance of the plant in the future is the best measure. Poison ivy plant can be recognized by the number of leaves. Generally, poison ivy has three leaves with flowering branches on a single stem. Diphenhydramine may be  purchased over the counter and used as needed for itching. Do not drive with this medication if it makes you drowsy.Ask your caregiver about medication for children. SEEK MEDICAL CARE IF:  Open sores develop.  Redness spreads beyond area of rash.  You notice purulent (pus-like) discharge.  You have increased pain.  Other signs of infection develop (such as fever).   This information is not intended to replace advice given to you by your health care provider. Make sure you discuss any questions you have with your health care provider.   Document Released: 08/11/2000 Document Revised: 11/06/2011 Document Reviewed: 01/20/2015 Elsevier Interactive Patient Education [employer].

## 2016-01-25 NOTE — Progress Notes (Signed)
Marjory Sneddon, D.O. Family Medicine Physician [facility] Location: Primary Care at [facility]     Subjective:    CC: New pt  HPI: Regina Mcconnell is a pleasant 45 y.o. female who presents to [facility] today for Acute rash.  Patient was working in her garden this weekend on Saturday and the next day she noticed that she had a itchy, blistery-appearing rash on her right forearm.  It has progressively gotten worse and now has gone to her left forearm and chest.    She gets poison ivy dermatitis many times during the summer as it is in her yard. When it begins to spread as it is now she usually gets a injection of steroids as well as follows up with a 5 day oral course. She also has been using triamcinolone cream on it since and it has been getting worse. She denies any rash or tongue swelling or difficulty breathing.  Past Medical History  Diagnosis Date  . Heart murmur   . Abnormal Pap smear of cervix 1995    --hx colposcopy w/bx and cryotherapy of cervix(paps normal since)  . Abnormal uterine bleeding     reason for ablation in 2009  . Urinary incontinence     especially with exercise    Past Surgical History  Procedure Laterality Date  . Endometrial ablation  2009    Dr. Quincy Simmonds  . Dilation and curettage of uterus      due to heavy cycles   Family History  Problem Relation Age of Onset  . Diabetes Mother   . Thyroid disease Mother   . Heart disease Father   . Heart disease Paternal Uncle     all deceased from heart attacks  . Stroke Paternal Grandmother     History  Drug Use No  ,  History  Alcohol Use  . 0.0 oz/week  . 0 Standard drinks or equivalent per week    Comment: once or twice a year  ,  History  Smoking status  . Never Smoker   Smokeless tobacco  . Not on file  ,  History  Sexual Activity  . Sexual Activity:  . Partners: Male  . Birth Control/ Protection: None    Patient's Medications  New  Prescriptions   No medications on file  Previous Medications   No medications on file  Modified Medications   No medications on file  Discontinued Medications   AMOXICILLIN-CLAVULANATE (AUGMENTIN) 875-125 MG TABLET    Take 1 tablet by mouth 2 (two) times daily. For 10 days.   CLINDAMYCIN (CLEOCIN-T) 1 % LOTION    Apply topically 2 (two) times daily.   PHENTERMINE (ADIPEX-P) 37.5 MG TABLET    Take 1 tablet (37.5 mg total) by mouth daily before breakfast.   PSEUDOEPHEDRINE-CODEINE-GUAIFENESIN (MYTUSSIN DAC) 30-10-100 MG/5ML SOLUTION    Take 10 mLs by mouth 4 (four) times daily as needed for cough.    Review of patient's allergies indicates no known allergies.   Review of Systems: Full 14 point ROS performed via "adult medical history form".  Negative except for rash   Objective:   Blood pressure 125/79, pulse 64, SpO2 100 %.  General: Well Developed, well nourished, and in no acute distress.  Neuro: Alert and oriented x3, extra-ocular muscles intact HEENT: Normocephalic, atraumatic Skin: + rash on R FA, starting a couple vesicles on L FA and one lesion on ant chest Cardiac: Regular rate and rhythm, no  murmurs rubs or gallops.  Respiratory: Essentially clear to auscultation bilaterally. Not using accessory muscles, speaking in full sentences.  Musculoskeletal: Ambulates w/o diff, FROM * 4 ext.  Vasc: less 2 sec cap RF, warm and pink  Psych:  No HI/SI, judgement and insight good.    Impression and Recommendations:    The patient was counselled, risk factors were discussed, anticipatory guidance given.  Poison ivy dermatitis Solumedrol 125mg  IM today in the office.  TAC 0.1% RF and oral pred 5d.  Cont topical ivarest spray prn.  Call or RTC if W or NI

## 2016-01-27 ENCOUNTER — Ambulatory Visit (INDEPENDENT_AMBULATORY_CARE_PROVIDER_SITE_OTHER): Payer: 59 | Admitting: Family Medicine

## 2016-01-27 VITALS — BP 104/68 | HR 71

## 2016-01-27 DIAGNOSIS — L237 Allergic contact dermatitis due to plants, except food: Secondary | ICD-10-CM | POA: Diagnosis not present

## 2016-01-27 MED ORDER — METHYLPREDNISOLONE ACETATE 80 MG/ML IJ SUSP
80.0000 mg | Freq: Once | INTRAMUSCULAR | Status: AC
  Administered 2016-01-27: 80 mg via INTRAMUSCULAR

## 2016-01-27 MED ORDER — METHYLPREDNISOLONE SODIUM SUCC 125 MG IJ SOLR
125.0000 mg | Freq: Once | INTRAMUSCULAR | Status: AC
  Administered 2016-01-27: 125 mg via INTRAMUSCULAR

## 2016-01-27 NOTE — Progress Notes (Signed)
   Subjective:    Patient ID: Regina Mcconnell, female    DOB: 1970/12/09, 45 y.o.   MRN: [id number]  HPI  Regina Mcconnell is here to day due to poison Ivy rash on right arm.  She was seen yesterday for an acute appointment for poison ivy and treated with Solu-Medrol and oral prednisone. It was getting a little bit better last night but then today it actually seems worse. It's red and intensely itchy. She said last year she had a similar occurrence where she had to be treated twice for the same episode. It's primarily over her upper chest and right forearm and left forearm.  Review of Systems     Objective:   Physical Exam  Constitutional: She is oriented to person, place, and time. She appears well-developed and well-nourished.  HENT:  Head: Normocephalic and atraumatic.  Eyes: Conjunctivae and EOM are normal.  Cardiovascular: Normal rate.   Pulmonary/Chest: Effort normal.  Neurological: She is alert and oriented to person, place, and time.  Skin: Skin is dry. No pallor.  She has erythematous linear papules over the right forearm and upper chest.  Psychiatric: She has a normal mood and affect. Her behavior is normal.  Vitals reviewed.         Assessment & Plan:  Poison Ivy - Per Dr Madilyn Fireman Solu Medro 125 mg and Depo Medrol 80 mg. Patient tolerated injection well without complications. Call if not improving over the next week. Continue oral prednisone as well. She was finally able to get the topical steroid cream as well.  Beatrice Lecher, MD

## 2016-01-31 ENCOUNTER — Other Ambulatory Visit: Payer: Self-pay | Admitting: Sports Medicine

## 2016-01-31 MED ORDER — HYDROXYZINE HCL 50 MG PO TABS: 25.0000 mg | ORAL_TABLET | Freq: Three times a day (TID) | ORAL | Status: DC | PRN

## 2016-01-31 MED ORDER — PREDNISONE 10 MG (21) PO TBPK: ORAL_TABLET | ORAL | Status: DC

## 2016-01-31 MED FILL — hydrOXYzine HCL 50 MG TABS: 50 | 20 days supply | Qty: 30 | Fill #0

## 2016-01-31 MED FILL — predniSONE 10 MG TABS: 10 | 12 days supply | Qty: 48 | Fill #0

## 2016-03-06 DIAGNOSIS — H5213 Myopia, bilateral: Secondary | ICD-10-CM | POA: Diagnosis not present

## 2017-03-07 DIAGNOSIS — H52222 Regular astigmatism, left eye: Secondary | ICD-10-CM | POA: Diagnosis not present

## 2017-03-07 DIAGNOSIS — H524 Presbyopia: Secondary | ICD-10-CM | POA: Diagnosis not present

## 2017-03-07 DIAGNOSIS — H5213 Myopia, bilateral: Secondary | ICD-10-CM | POA: Diagnosis not present

## 2017-05-10 MED FILL — AZITHROMYCIN 250 MG TABLET: 250 | 5 days supply | Qty: 6 | Fill #0

## 2017-05-21 ENCOUNTER — Emergency Department (INDEPENDENT_AMBULATORY_CARE_PROVIDER_SITE_OTHER)
Admission: EM | Admit: 2017-05-21 | Discharge: 2017-05-21 | Disposition: A | Discharge status: Home / Self Care | Payer: 59 | Source: Home / Self Care | Attending: Family Medicine | Admitting: Family Medicine

## 2017-05-21 ENCOUNTER — Emergency Department (INDEPENDENT_AMBULATORY_CARE_PROVIDER_SITE_OTHER): Payer: 59

## 2017-05-21 ENCOUNTER — Encounter: Payer: Self-pay | Admitting: *Deleted

## 2017-05-21 DIAGNOSIS — R1011 Right upper quadrant pain: Secondary | ICD-10-CM

## 2017-05-21 DIAGNOSIS — R300 Dysuria: Secondary | ICD-10-CM

## 2017-05-21 DIAGNOSIS — R1010 Upper abdominal pain, unspecified: Secondary | ICD-10-CM

## 2017-05-21 LAB — POCT CBC W AUTO DIFF (K'VILLE URGENT CARE)

## 2017-05-21 LAB — POCT URINALYSIS DIP (MANUAL ENTRY)
Blood, UA: NEGATIVE
GLUCOSE UA: NEGATIVE mg/dL
Leukocytes, UA: NEGATIVE
Nitrite, UA: NEGATIVE
Protein Ur, POC: 30 mg/dL — AB
Urobilinogen, UA: 2 E.U./dL — AB
pH, UA: 5.5 (ref 5.0–8.0)

## 2017-05-21 NOTE — Discharge Instructions (Signed)
Continue low fat diet and increase fluid intake.

## 2017-05-21 NOTE — ED Triage Notes (Addendum)
Patient c/o 2 weeks of intermittent nausea, sweats, chills and abdominal pain. Pain is worse after eating. Today pain is on her right flank. Dysuria started today.

## 2017-05-21 NOTE — ED Provider Notes (Signed)
[facility]    CSN: [id number] Arrival date & time: 05/21/17  1344     History   Chief Complaint Chief Complaint  Patient presents with  . Abdominal Pain  . Nausea  . Dysuria    HPI Regina Mcconnell is a 46 y.o. female.   Patient reports that she has not felt well for about a month.  During the past two weeks she has nausea about an hour after eating, especially fatty foods.  No vomiting.  She has had recurring chills/sweats during the past 2 weeks and appetite has been decreased.  She has been increasingly fatigued during the past two days.  Today she also noted dark urine and mild dysuria. She has a family history of gall bladder disease (mother).   The history is provided by the patient.  Abdominal Pain  Pain location:  RUQ Pain quality: aching and bloating   Pain radiation: right back. Pain severity:  Mild Onset quality:  Gradual Duration:  2 weeks Timing:  Intermittent Progression:  Worsening Chronicity:  New Context: awakening from sleep and eating   Context: not diet changes, not recent illness, not recent travel, not sick contacts, not suspicious food intake and not trauma   Relieved by:  Nothing Worsened by:  Eating Ineffective treatments:  None tried Associated symptoms: anorexia, chills, dysuria, fatigue, flatus, nausea and shortness of breath   Associated symptoms: no belching, no chest pain, no constipation, no cough, no diarrhea, no fever, no hematemesis, no hematochezia, no hematuria, no melena, no sore throat, no vaginal bleeding, no vaginal discharge and no vomiting   Dysuria  Associated symptoms: abdominal pain and nausea   Associated symptoms: no fever, no vaginal discharge and no vomiting     Past Medical History:  Diagnosis Date  . Abnormal Pap smear of cervix 1995   --hx colposcopy w/bx and cryotherapy of cervix(paps normal since)  . Abnormal uterine bleeding    reason for ablation in 2009  . Heart murmur   . Urinary incontinence      especially with exercise    Patient Active Problem List   Diagnosis Date Noted  . Poison ivy dermatitis 01/02/2013  . Family history of early CAD 07/23/2012  . Family history of diabetes mellitus 07/23/2012  . Heart murmur 07/23/2012  . Acute low back pain with disc symptoms, duration less than 6 weeks 06/03/2012    Past Surgical History:  Procedure Laterality Date  . DILATION AND CURETTAGE OF UTERUS     due to heavy cycles  . ENDOMETRIAL ABLATION  2009   Dr. Quincy Simmonds    OB History    Durenda Age Para Term Preterm AB Living   1 1 1     1    SAB TAB Ectopic Multiple Live Births                   Home Medications    Prior to Admission medications   Medication Sig Start Date End Date Taking? Authorizing Provider  hydrOXYzine (ATARAX/VISTARIL) 50 MG tablet Take 0.5 tablets (25 mg total) by mouth 3 (three) times daily as needed. For itching. 01/31/16   Silverio Decamp, MD    Family History Family History  Problem Relation Age of Onset  . Diabetes Mother   . Thyroid disease Mother   . Heart disease Father   . Heart disease Paternal Uncle        all deceased from heart attacks  . Stroke Paternal Grandmother  Social History Social History  Substance Use Topics  . Smoking status: Never Smoker  . Smokeless tobacco: Never Used  . Alcohol use 0.0 oz/week     Comment: once or twice a year     Allergies   Patient has no known allergies.   Review of Systems Review of Systems  Constitutional: Positive for chills and fatigue. Negative for fever.  HENT: Negative for sore throat.   Respiratory: Positive for shortness of breath. Negative for cough.   Cardiovascular: Negative for chest pain.  Gastrointestinal: Positive for abdominal pain, anorexia, flatus and nausea. Negative for abdominal distention, blood in stool, constipation, diarrhea, hematemesis, hematochezia, melena and vomiting.  Genitourinary: Positive for dysuria. Negative for hematuria, vaginal bleeding  and vaginal discharge.  All other systems reviewed and are negative.    Physical Exam Triage Vital Signs ED Triage Vitals [05/21/17 1419]  Enc Vitals Group     BP      Pulse      Resp      Temp      Temp src      SpO2      Weight 245 lb (111.1 kg)     Height      Head Circumference      Peak Flow      Pain Score 2     Pain Loc      Pain Edu?      Excl. in [place]?    No data found.   Updated Vital Signs Wt 245 lb (111.1 kg)   BMI 42.05 kg/m   Visual Acuity Right Eye Distance:   Left Eye Distance:   Bilateral Distance:    Right Eye Near:   Left Eye Near:    Bilateral Near:     Physical Exam  Constitutional: She appears well-developed and well-nourished. No distress.  HENT:  Head: Normocephalic.  Right Ear: External ear normal.  Left Ear: External ear normal.  Nose: Nose normal.  Mouth/Throat: Oropharynx is clear and moist.  Eyes: Pupils are equal, round, and reactive to light. Conjunctivae are normal.  Cardiovascular: Normal heart sounds.   Pulmonary/Chest: Breath sounds normal.  Abdominal: Bowel sounds are normal. She exhibits no distension. There is no hepatosplenomegaly. There is tenderness in the left lower quadrant. There is no rigidity, no guarding, no CVA tenderness, no tenderness at McBurney's point and negative Murphy's sign.    Musculoskeletal: She exhibits no edema.  Lymphadenopathy:    She has no cervical adenopathy.  Neurological: She is alert.  Skin: Skin is warm and dry.  Nursing note and vitals reviewed.    UC Treatments / Results  Labs (all labs ordered are listed, but only abnormal results are displayed) Labs Reviewed  POCT URINALYSIS DIP (MANUAL ENTRY) - Abnormal; Notable for the following:       Result Value   Clarity, UA cloudy (*)    Bilirubin, UA moderate (*)    Ketones, POC UA small (15) (*)    Spec Grav, UA >=1.030 (*)    Protein Ur, POC =30 (*)    Urobilinogen, UA 2.0 (*)    All other components within normal limits    COMPREHENSIVE METABOLIC PANEL  POCT CBC W AUTO DIFF ([facility]):  WBC 7.6; LY 63.8; MO 9.8; GR 26.4; Hgb 13.0; Platelets 197     EKG  EKG Interpretation None       Radiology US Abdomen Complete  Result Date: 05/21/2017 CLINICAL DATA:  Right upper quadrant pain EXAM: ABDOMEN ULTRASOUND  COMPLETE COMPARISON:  None. FINDINGS: Gallbladder: Gallbladder is not fully distended. Within this limitation no gallstones are evident. Gallbladder wall thickness upper normal. Common bile duct: Diameter: 5 mm Liver: No focal lesion identified. Within normal limits in parenchymal echogenicity. Portal vein is patent on color Doppler imaging with normal direction of blood flow towards the liver. IVC: Not well seen due to overlying bowel gas. Pancreas: Obscured by bowel gas. Spleen: Size and appearance within normal limits. Right Kidney: Length: 10.0 cm. Echogenicity within normal limits. No mass or hydronephrosis visualized. Left Kidney: Length: 12.1 cm. Echogenicity within normal limits. No mass or hydronephrosis visualized. Abdominal aorta: Not well seen due to bowel gas. Other findings: None. IMPRESSION: Unremarkable abdominal ultrasound with some limitation due to midline overlying bowel gas. Electronically Signed   By: Misty Stanley M.D.   On: 05/21/2017 15:16    Procedures Procedures (including critical care time)  Medications Ordered in UC Medications - No data to display   Initial Impression / Assessment and Plan / UC Course  I have reviewed the triage vital signs and the nursing notes.  Pertinent labs & imaging results that were available during my care of the patient were reviewed by me and considered in my medical decision making (see chart for details).    Normal WBC and negative [place] reassuring. CMP, amylase, lipase, and urine culture pending. Continue low fat diet and increase fluid intake.    Final Clinical Impressions(s) / UC Diagnoses   Final diagnoses:  Pain of upper  abdomen  Dysuria    New Prescriptions New Prescriptions   No medications on file         Kandra Nicolas, MD 05/21/17 1544

## 2017-05-22 ENCOUNTER — Telehealth: Payer: Self-pay | Admitting: Physician Assistant

## 2017-05-22 DIAGNOSIS — R1011 Right upper quadrant pain: Secondary | ICD-10-CM

## 2017-05-22 DIAGNOSIS — R748 Abnormal levels of other serum enzymes: Secondary | ICD-10-CM

## 2017-05-22 LAB — COMPREHENSIVE METABOLIC PANEL
AG RATIO: 1.4 (calc) (ref 1.0–2.5)
ALBUMIN MSPROF: 4 g/dL (ref 3.6–5.1)
ALKALINE PHOSPHATASE (APISO): 133 U/L — AB (ref 33–115)
ALT: 133 U/L — ABNORMAL HIGH (ref 6–29)
AST: 91 U/L — AB (ref 10–35)
BILIRUBIN TOTAL: 0.7 mg/dL (ref 0.2–1.2)
BUN: 12 mg/dL (ref 7–25)
CALCIUM: 8.6 mg/dL (ref 8.6–10.2)
CHLORIDE: 105 mmol/L (ref 98–110)
CO2: 21 mmol/L (ref 20–32)
CREATININE: 0.79 mg/dL (ref 0.50–1.10)
GLOBULIN: 2.9 g/dL (ref 1.9–3.7)
Glucose, Bld: 108 mg/dL — ABNORMAL HIGH (ref 65–99)
POTASSIUM: 3.8 mmol/L (ref 3.5–5.3)
Sodium: 136 mmol/L (ref 135–146)
Total Protein: 6.9 g/dL (ref 6.1–8.1)

## 2017-05-22 LAB — URINE CULTURE
MICRO NUMBER:: 81054802
SPECIMEN QUALITY:: ADEQUATE

## 2017-05-22 NOTE — Telephone Encounter (Signed)
Pt is a was seen in UC on 05/21/2017 for abdominal pain, nausea, chills, sweating for last 5 days and not feeling well for last 2 weeks. [place] was poorly visulized due to gas. Symptoms could represent a stone in bilary duct that not seen on UC. Liver enzymes ALT 133 4x normal limit, AST 3x normal limit, Alk phos 133.   Pt does not drink alcohol, no tylenol, no medications.   HIDA scan ordered.

## 2017-05-23 ENCOUNTER — Other Ambulatory Visit: Payer: Self-pay | Admitting: Physician Assistant

## 2017-05-23 ENCOUNTER — Encounter: Payer: Self-pay | Admitting: Physician Assistant

## 2017-05-23 ENCOUNTER — Ambulatory Visit (INDEPENDENT_AMBULATORY_CARE_PROVIDER_SITE_OTHER): Payer: 59

## 2017-05-23 DIAGNOSIS — R1011 Right upper quadrant pain: Secondary | ICD-10-CM

## 2017-05-23 DIAGNOSIS — R161 Splenomegaly, not elsewhere classified: Secondary | ICD-10-CM

## 2017-05-23 DIAGNOSIS — R1010 Upper abdominal pain, unspecified: Secondary | ICD-10-CM

## 2017-05-23 DIAGNOSIS — R748 Abnormal levels of other serum enzymes: Secondary | ICD-10-CM | POA: Insufficient documentation

## 2017-05-23 MED ORDER — IOPAMIDOL (ISOVUE-300) INJECTION 61%: 100.0000 mL | Freq: Once | INTRAVENOUS | Status: DC | PRN

## 2017-05-28 ENCOUNTER — Other Ambulatory Visit: Payer: Self-pay | Admitting: Physician Assistant

## 2017-05-28 DIAGNOSIS — R748 Abnormal levels of other serum enzymes: Secondary | ICD-10-CM | POA: Diagnosis not present

## 2017-05-28 LAB — TEST AUTHORIZATION

## 2017-05-28 LAB — EPSTEIN-BARR VIRUS VCA ANTIBODY PANEL
EBV NA IgG: 381 U/mL — ABNORMAL HIGH
EBV VCA IgG: 271 U/mL — ABNORMAL HIGH
EBV VCA IgM: <36 U/mL

## 2017-05-28 LAB — AMYLASE: AMYLASE: 36 U/L (ref 21–101)

## 2017-05-28 LAB — LIPASE: LIPASE: 32 U/L (ref 7–60)

## 2017-05-29 LAB — HEPATIC FUNCTION PANEL
AG Ratio: 1.5 (calc) (ref 1.0–2.5)
ALKALINE PHOSPHATASE (APISO): 106 U/L (ref 33–115)
ALT: 102 U/L — AB (ref 6–29)
AST: 72 U/L — ABNORMAL HIGH (ref 10–35)
Albumin: 3.7 g/dL (ref 3.6–5.1)
BILIRUBIN TOTAL: 0.6 mg/dL (ref 0.2–1.2)
Bilirubin, Direct: 0.1 mg/dL (ref 0.0–0.2)
Globulin: 2.5 g/dL (calc) (ref 1.9–3.7)
Indirect Bilirubin: 0.5 mg/dL (calc) (ref 0.2–1.2)
TOTAL PROTEIN: 6.2 g/dL (ref 6.1–8.1)

## 2017-05-29 LAB — CBC WITH DIFFERENTIAL/PLATELET
BASOS ABS: 59 {cells}/uL (ref 0–200)
Basophils Relative: 0.9 %
EOS PCT: 2.3 %
Eosinophils Absolute: 150 cells/uL (ref 15–500)
HCT: 36.7 % (ref 35.0–45.0)
HEMOGLOBIN: 12.3 g/dL (ref 11.7–15.5)
Lymphs Abs: 3796 cells/uL (ref 850–3900)
MCH: 29.2 pg (ref 27.0–33.0)
MCHC: 33.5 g/dL (ref 32.0–36.0)
MCV: 87.2 fL (ref 80.0–100.0)
MONOS PCT: 5.8 %
MPV: 9.8 fL (ref 7.5–12.5)
NEUTROS ABS: 2119 {cells}/uL (ref 1500–7800)
Neutrophils Relative %: 32.6 %
PLATELETS: 180 10*3/uL (ref 140–400)
RBC: 4.21 10*6/uL (ref 3.80–5.10)
RDW: 12.9 % (ref 11.0–15.0)
Total Lymphocyte: 58.4 %
WBC mixed population: 377 cells/uL (ref 200–950)
WBC: 6.5 10*3/uL (ref 3.8–10.8)

## 2017-05-29 LAB — IRON,TIBC AND FERRITIN PANEL
%SAT: 20 % (ref 11–50)
Ferritin: 259 ng/mL — ABNORMAL HIGH (ref 10–232)
Iron: 65 ug/dL (ref 40–190)
TIBC: 332 ug/dL (ref 250–450)

## 2017-05-30 LAB — HEPATITIS PANEL, ACUTE
Hep A IgM: NONREACTIVE
Hep B C IgM: NONREACTIVE
Hepatitis B Surface Ag: NONREACTIVE
Hepatitis C Ab: NONREACTIVE
SIGNAL TO CUT-OFF: 0.72 (ref <1.00)

## 2017-06-04 ENCOUNTER — Encounter (HOSPITAL_COMMUNITY)
Admission: RE | Admit: 2017-06-04 | Discharge: 2017-06-04 | Disposition: A | Discharge status: Home / Self Care | Payer: 59 | Source: Ambulatory Visit | Attending: Physician Assistant | Admitting: Physician Assistant

## 2017-06-04 DIAGNOSIS — R1011 Right upper quadrant pain: Secondary | ICD-10-CM | POA: Insufficient documentation

## 2017-06-04 DIAGNOSIS — R112 Nausea with vomiting, unspecified: Secondary | ICD-10-CM | POA: Diagnosis not present

## 2017-06-04 MED ORDER — TECHNETIUM TC 99M MEBROFENIN IV KIT
5.3000 | PACK | Freq: Once | INTRAVENOUS | Status: AC | PRN
  Administered 2017-06-04: 5.3 via INTRAVENOUS

## 2017-06-13 ENCOUNTER — Encounter: Payer: Self-pay | Admitting: Family Medicine

## 2017-06-13 LAB — ENDO/OSCOPIES
EBV AB VCA, IGG: 271
EBV AB VCA, IGM: <36
EBV Nuclear Antigen Ab, IgG: 381

## 2017-06-19 ENCOUNTER — Telehealth: Payer: Self-pay | Admitting: Physician Assistant

## 2017-06-19 DIAGNOSIS — R7989 Other specified abnormal findings of blood chemistry: Secondary | ICD-10-CM | POA: Diagnosis not present

## 2017-06-19 DIAGNOSIS — R911 Solitary pulmonary nodule: Secondary | ICD-10-CM

## 2017-06-19 DIAGNOSIS — R1010 Upper abdominal pain, unspecified: Secondary | ICD-10-CM

## 2017-06-19 DIAGNOSIS — R748 Abnormal levels of other serum enzymes: Secondary | ICD-10-CM

## 2017-06-19 NOTE — Telephone Encounter (Signed)
Updated problem list

## 2017-06-20 LAB — HEPATIC FUNCTION PANEL
AG Ratio: 1.5 (calc) (ref 1.0–2.5)
ALBUMIN MSPROF: 4.4 g/dL (ref 3.6–5.1)
ALT: 21 U/L (ref 6–29)
AST: 20 U/L (ref 10–35)
Alkaline phosphatase (APISO): 78 U/L (ref 33–115)
BILIRUBIN DIRECT: 0.1 mg/dL (ref 0.0–0.2)
BILIRUBIN TOTAL: 0.6 mg/dL (ref 0.2–1.2)
GLOBULIN: 2.9 g/dL (ref 1.9–3.7)
Indirect Bilirubin: 0.5 mg/dL (calc) (ref 0.2–1.2)
Total Protein: 7.3 g/dL (ref 6.1–8.1)

## 2017-06-20 LAB — FERRITIN: Ferritin: 107 ng/mL (ref 10–232)

## 2017-10-16 MED FILL — valACYclovir HCL 1 GM TABS: 1 | 2 days supply | Qty: 4 | Fill #0

## 2018-01-07 ENCOUNTER — Telehealth: Payer: Self-pay

## 2018-01-07 ENCOUNTER — Other Ambulatory Visit: Payer: Self-pay | Admitting: Physician Assistant

## 2018-01-07 MED ORDER — VALACYCLOVIR HCL 1 G PO TABS: 2000.0000 mg | ORAL_TABLET | Freq: Two times a day (BID) | ORAL | 0 refills | Status: DC

## 2018-01-07 MED FILL — valACYclovir HCL 1 GM TABS: 1 | 3 days supply | Qty: 12 | Fill #0

## 2018-01-07 NOTE — Progress Notes (Signed)
Sent rx for cold sores.

## 2018-01-07 NOTE — Telephone Encounter (Signed)
Barbar needs a refill on valacyclovir. She has a cold sore. Please advise.

## 2018-02-05 ENCOUNTER — Encounter: Payer: Self-pay | Admitting: Physician Assistant

## 2018-02-05 ENCOUNTER — Ambulatory Visit (INDEPENDENT_AMBULATORY_CARE_PROVIDER_SITE_OTHER): Payer: 59 | Admitting: Physician Assistant

## 2018-02-05 VITALS — BP 136/84 | HR 70 | Ht 64.0 in | Wt 246.0 lb

## 2018-02-05 DIAGNOSIS — Z13220 Encounter for screening for lipoid disorders: Secondary | ICD-10-CM

## 2018-02-05 DIAGNOSIS — Z131 Encounter for screening for diabetes mellitus: Secondary | ICD-10-CM

## 2018-02-05 DIAGNOSIS — E66813 Obesity, class 3: Secondary | ICD-10-CM | POA: Insufficient documentation

## 2018-02-05 DIAGNOSIS — Z0000 Encounter for general adult medical examination without abnormal findings: Secondary | ICD-10-CM

## 2018-02-05 DIAGNOSIS — Z6841 Body Mass Index (BMI) 40.0 and over, adult: Secondary | ICD-10-CM

## 2018-02-05 DIAGNOSIS — Z1231 Encounter for screening mammogram for malignant neoplasm of breast: Secondary | ICD-10-CM

## 2018-02-05 MED ORDER — PHENTERMINE HCL 37.5 MG PO TABS: 37.5000 mg | ORAL_TABLET | Freq: Every day | ORAL | 0 refills | Status: DC

## 2018-02-05 MED FILL — PHENTERMINE 37.5 MG TABLET: 37.5 | 30 days supply | Qty: 30 | Fill #0

## 2018-02-05 NOTE — Patient Instructions (Addendum)
Dr. Harlow Mares for plastic surgery.  Phentermine for now.   Consider belviq, saxenda, contrave  Keeping You Healthy  Get These Tests 1. Blood Pressure- Have your blood pressure checked once a year by your health care provider.  Normal blood pressure is 120/80. 2. Weight- Have your body mass index (BMI) calculated to screen for obesity.  BMI is measure of body fat based on height and weight.  You can also calculate your own BMI at GravelBags.it. 3. Cholesterol- Have your cholesterol checked every 5 years starting at age 29 then yearly starting at age 68. 65. Chlamydia, HIV, and other sexually transmitted diseases- Get screened every year until age 16, then within three months of each new sexual provider. 5. Pap Test - Every 1-5 years; discuss with your health care provider. 6. Mammogram- Every 1-2 years starting at age 29--50  Take these medicines  Calcium with Vitamin D-Your body needs 1200 mg of Calcium each day and [phone] IU of Vitamin D daily.  Your body can only absorb 500 mg of Calcium at a time so Calcium must be taken in 2 or 3 divided doses throughout the day.  Multivitamin with folic acid- Once daily if it is possible for you to become pregnant.  Get these Immunizations  Gardasil-Series of three doses; prevents HPV related illness such as genital warts and cervical cancer.  Menactra-Single dose; prevents meningitis.  Tetanus shot- Every 10 years.  Flu shot-Every year.  Take these steps 1. Do not smoke-Your healthcare provider can help you quit.  For tips on how to quit go to www.smokefree.gov or call 1-800 QUITNOW. 2. Be physically active- Exercise 5 days a week for at least 30 minutes.  If you are not already physically active, start slow and gradually work up to 30 minutes of moderate physical activity.  Examples of moderate activity include walking briskly, dancing, swimming, bicycling, etc. 3. Breast Cancer- A self breast exam every month is important for early  detection of breast cancer.  For more information and instruction on self breast exams, ask your healthcare provider or https://www.patel.info/. 4. Eat a healthy diet- Eat a variety of healthy foods such as fruits, vegetables, whole grains, low fat milk, low fat cheeses, yogurt, lean meats, poultry and fish, beans, nuts, tofu, etc.  For more information go to www. Thenutritionsource.org 5. Drink alcohol in moderation- Limit alcohol intake to one drink or less per day. Never drink and drive. 6. Depression- Your emotional health is as important as your physical health.  If you're feeling down or losing interest in things you normally enjoy please talk to your healthcare provider about being screened for depression. 7. Dental visit- Brush and floss your teeth twice daily; visit your dentist twice a year. 8. Eye doctor- Get an eye exam at least every 2 years. 9. Helmet use- Always wear a helmet when riding a bicycle, motorcycle, rollerblading or skateboarding. 24. Safe sex- If you may be exposed to sexually transmitted infections, use a condom. 11. Seat belts- Seat belts can save your live; always wear one. 12. Smoke/Carbon Monoxide detectors- These detectors need to be installed on the appropriate level of your home. Replace batteries at least once a year. 13. Skin cancer- When out in the sun please cover up and use sunscreen 15 SPF or higher. 14. Violence- If anyone is threatening or hurting you, please tell your healthcare provider.

## 2018-02-06 ENCOUNTER — Telehealth: Payer: Self-pay | Admitting: Physician Assistant

## 2018-02-06 ENCOUNTER — Encounter: Payer: Self-pay | Admitting: Physician Assistant

## 2018-02-06 ENCOUNTER — Other Ambulatory Visit (INDEPENDENT_AMBULATORY_CARE_PROVIDER_SITE_OTHER): Payer: 59 | Admitting: Physician Assistant

## 2018-02-06 DIAGNOSIS — E538 Deficiency of other specified B group vitamins: Secondary | ICD-10-CM | POA: Diagnosis not present

## 2018-02-06 MED ORDER — CYANOCOBALAMIN 1000 MCG/ML IJ SOLN
1000.0000 ug | Freq: Once | INTRAMUSCULAR | Status: AC
  Administered 2018-02-06: 1000 ug via INTRAMUSCULAR

## 2018-02-06 NOTE — Telephone Encounter (Signed)
Opened in error

## 2018-02-06 NOTE — Progress Notes (Signed)
B12 low normal. Start b12 1074mcg shot and then sublingual 1049mcg daily.

## 2018-02-06 NOTE — Progress Notes (Signed)
Per Provider, Pt's last B12 levels were low. B12 injection administered in right deltoid today. Pt tolerated well, no immediate complications. Advised she can get next injection in 1 month.

## 2018-02-06 NOTE — Progress Notes (Signed)
Subjective:     Regina Mcconnell is a 47 y.o. female and is here for a comprehensive physical exam. The patient reports no problems.  Social History   Socioeconomic History  . Marital status: Married    Spouse name: Regina Mcconnell  . Number of children: 1  . Years of education: Assoc degr  . Highest education level: Not on file  Occupational History  . Occupation: Counsellor: [employer]  . Financial resource strain: Not on file  . Food insecurity:    Worry: Not on file    Inability: Not on file  . Transportation needs:    Medical: Not on file    Non-medical: Not on file  Tobacco Use  . Smoking status: Never Smoker  . Smokeless tobacco: Never Used  Substance and Sexual Activity  . Alcohol use: Yes    Alcohol/week: 0.0 oz    Comment: once or twice a year  . Drug use: No  . Sexual activity: Yes    Partners: Male    Birth control/protection: None  Lifestyle  . Physical activity:    Days per week: Not on file    Minutes per session: Not on file  . Stress: Not on file  Relationships  . Social connections:    Talks on phone: Not on file    Gets together: Not on file    Attends religious service: Not on file    Active member of club or organization: Not on file    Attends meetings of clubs or organizations: Not on file    Relationship status: Not on file  . Intimate partner violence:    Fear of current or ex partner: Not on file    Emotionally abused: Not on file    Physically abused: Not on file    Forced sexual activity: Not on file  Other Topics Concern  . Not on file  Social History Narrative   Working out 1 hour a day.  1-2 caffeine drinks per day.     Health Maintenance  Topic Date Due  . HIV Screening  02/06/2019 (Originally 05/08/1986)  . INFLUENZA VACCINE  03/28/2018  . PAP SMEAR  08/05/2018  . TETANUS/TDAP  02/08/2022    The following portions of the patient's history were reviewed and updated as appropriate: allergies, current  medications, past family history, past medical history, past social history, past surgical history and problem list.  Review of Systems A comprehensive review of systems was negative.   Objective:    BP 136/84   Pulse 70   Ht 5\' 4"  (1.626 m)   Wt 246 lb (111.6 kg)   BMI 42.23 kg/m  General appearance: alert, cooperative, appears stated age and morbidly obese Head: Normocephalic, without obvious abnormality, atraumatic Eyes: conjunctivae/corneas clear. PERRL, EOM's intact. Fundi benign. Ears: normal TM's and external ear canals both ears Nose: Nares normal. Septum midline. Mucosa normal. No drainage or sinus tenderness. Throat: lips, mucosa, and tongue normal; teeth and gums normal Neck: no adenopathy, no carotid bruit, no JVD, supple, symmetrical, trachea midline and thyroid not enlarged, symmetric, no tenderness/mass/nodules Back: symmetric, no curvature. ROM normal. No CVA tenderness. Lungs: clear to auscultation bilaterally Heart: regular rate and rhythm, S1, S2 normal, no murmur, click, rub or gallop Abdomen: soft, non-tender; bowel sounds normal; no masses,  no organomegaly Extremities: extremities normal, atraumatic, no cyanosis or edema Pulses: 2+ and symmetric Skin: Skin color, texture, turgor normal. No rashes or lesions Lymph  nodes: Cervical, supraclavicular, and axillary nodes normal. Neurologic: Alert and oriented X 3, normal strength and tone. Normal symmetric reflexes. Normal coordination and gait    Assessment:    Healthy female exam.      Plan:  Marland KitchenMarland KitchenTrudi was seen today for annual exam.  Diagnoses and all orders for this visit:  Routine physical examination -     COMPLETE METABOLIC PANEL WITH GFR -     Lipid Panel w/reflex Direct LDL -     MM 3D SCREEN BREAST BILATERAL -     CBC -     TSH -     Vitamin D 1,25 dihydroxy -     B12 and Folate Panel  Screening for lipid disorders -     Lipid Panel w/reflex Direct LDL  Screening for diabetes mellitus -      COMPLETE METABOLIC PANEL WITH GFR  Visit for screening mammogram -     MM 3D SCREEN BREAST BILATERAL  Class 3 severe obesity due to excess calories without serious comorbidity with body mass index (BMI) of 40.0 to 44.9 in adult (HCC) -     phentermine (ADIPEX-P) 37.5 MG tablet; Take 1 tablet (37.5 mg total) by mouth daily before breakfast.   .. Depression screen [facility] 2/9 02/05/2018  Decreased Interest 0  Down, Depressed, Hopeless 0  PHQ - 2 Score 0   .. Discussed 150 minutes of exercise a week.  Encouraged vitamin D 1000 units and Calcium 1300mg  or 4 servings of dairy a day.  Pap done at GYN.  Mammogram ordered.  Vaccines up to date.   Marland Kitchen.Discussed low carb diet with 1500 calories and 80g of protein.  Exercising at least 150 minutes a week.  My Fitness Pal could be a Microbiologist.  Discussed other medications. Gave names so that she can look into them. saxenda is too expensive on her save plan. She would like to try phentermine again. Follow up in 1 month nurse visit. Consider breast reduction which would help with weight loss and mobility in exercise. Gave name of Dr. Harlow Mares.    See After Visit Summary for Counseling Recommendations

## 2018-02-07 LAB — LIPID PANEL W/REFLEX DIRECT LDL
CHOL/HDL RATIO: 4.3 (calc) (ref <5.0)
CHOLESTEROL: 217 mg/dL — AB (ref <200)
HDL: 50 mg/dL — AB (ref >50)
LDL CHOLESTEROL (CALC): 145 mg/dL — AB
Non-HDL Cholesterol (Calc): 167 mg/dL (calc) — ABNORMAL HIGH (ref <130)
TRIGLYCERIDES: 105 mg/dL (ref <150)

## 2018-02-07 LAB — VITAMIN D 1,25 DIHYDROXY
Vitamin D 1, 25 (OH)2 Total: 22 pg/mL (ref 18–72)
Vitamin D2 1, 25 (OH)2: <8 pg/mL
Vitamin D3 1, 25 (OH)2: 22 pg/mL

## 2018-02-07 LAB — COMPLETE METABOLIC PANEL WITH GFR
AG Ratio: 1.7 (calc) (ref 1.0–2.5)
ALKALINE PHOSPHATASE (APISO): 77 U/L (ref 33–115)
ALT: 15 U/L (ref 6–29)
AST: 16 U/L (ref 10–35)
Albumin: 4.3 g/dL (ref 3.6–5.1)
BUN: 15 mg/dL (ref 7–25)
CO2: 23 mmol/L (ref 20–32)
CREATININE: 0.79 mg/dL (ref 0.50–1.10)
Calcium: 9.4 mg/dL (ref 8.6–10.2)
Chloride: 105 mmol/L (ref 98–110)
GFR, Est African American: 104 mL/min/{1.73_m2} (ref >=60)
GFR, Est Non African American: 90 mL/min/{1.73_m2} (ref >=60)
Globulin: 2.6 g/dL (calc) (ref 1.9–3.7)
Glucose, Bld: 92 mg/dL (ref 65–99)
Potassium: 4.5 mmol/L (ref 3.5–5.3)
Sodium: 139 mmol/L (ref 135–146)
Total Bilirubin: 0.5 mg/dL (ref 0.2–1.2)
Total Protein: 6.9 g/dL (ref 6.1–8.1)

## 2018-02-07 LAB — B12 AND FOLATE PANEL
Folate: 11.8 ng/mL
Vitamin B-12: 322 pg/mL (ref 200–1100)

## 2018-02-07 LAB — TSH: TSH: 2.88 mIU/L

## 2018-02-07 LAB — CBC
HCT: 40.6 % (ref 35.0–45.0)
Hemoglobin: 14.2 g/dL (ref 11.7–15.5)
MCH: 30 pg (ref 27.0–33.0)
MCHC: 35 g/dL (ref 32.0–36.0)
MCV: 85.7 fL (ref 80.0–100.0)
MPV: 10.6 fL (ref 7.5–12.5)
PLATELETS: 265 10*3/uL (ref 140–400)
RBC: 4.74 10*6/uL (ref 3.80–5.10)
RDW: 12.4 % (ref 11.0–15.0)
WBC: 7.3 10*3/uL (ref 3.8–10.8)

## 2018-02-08 NOTE — Progress Notes (Signed)
Call pt: vitamin D is low too. Needs to start d3 1000 units every day.

## 2018-02-27 ENCOUNTER — Ambulatory Visit
Admission: RE | Admit: 2018-02-27 | Discharge: 2018-02-27 | Disposition: A | Discharge status: Home / Self Care | Payer: 59 | Source: Ambulatory Visit | Attending: Physician Assistant | Admitting: Physician Assistant

## 2018-02-27 DIAGNOSIS — Z1231 Encounter for screening mammogram for malignant neoplasm of breast: Secondary | ICD-10-CM | POA: Diagnosis not present

## 2018-02-27 NOTE — Progress Notes (Signed)
Call pt: normal mammogram. Follow up in 1 year.

## 2018-04-23 DIAGNOSIS — H5213 Myopia, bilateral: Secondary | ICD-10-CM | POA: Diagnosis not present

## 2018-08-14 IMAGING — CT CT ABD-PELV W/O CM
2 of 4 series · 17 of 46 positions shown, 19 images · non-contrast
Comparison: None.

CLINICAL DATA: Upper quadrant pain and discomfort for several
weeks.

EXAM:
CT ABDOMEN AND PELVIS WITHOUT CONTRAST
TECHNIQUE: Multidetector CT imaging of the abdomen and pelvis was performed
following the standard protocol without IV contrast.

[Series 2: axial st · axial · 0.71mm/px · z∈[-509,-4]mm · 14 of 111 slices shown, 16 images]
[im 5/111  soft-tissue]
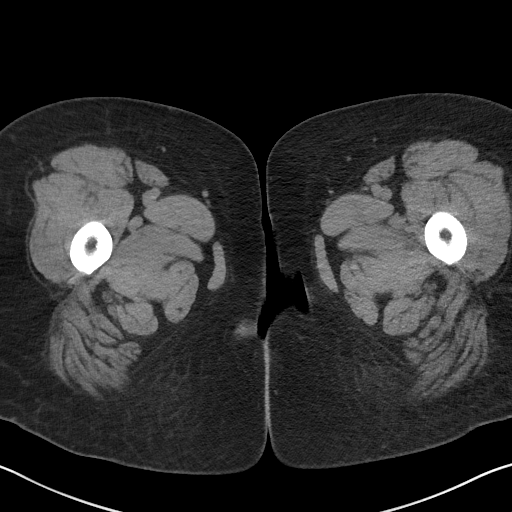
[im 5/111  bone]
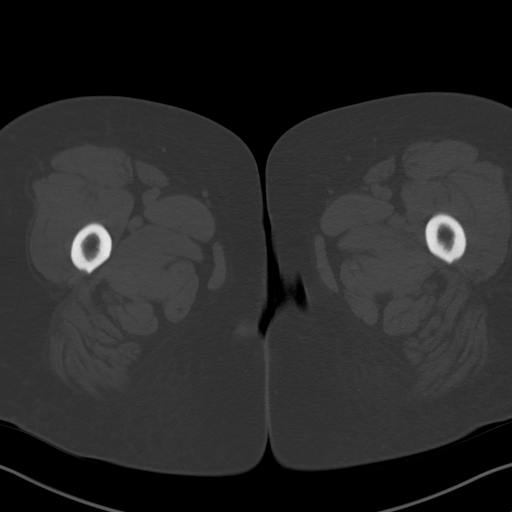
[im 14/111  soft-tissue]
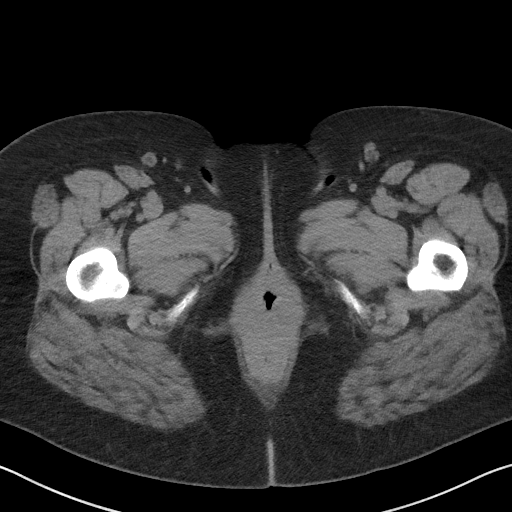
[im 23/111  soft-tissue]
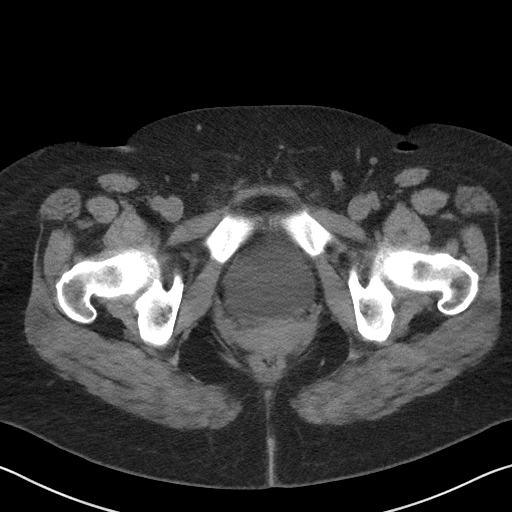
[im 31/111  soft-tissue]
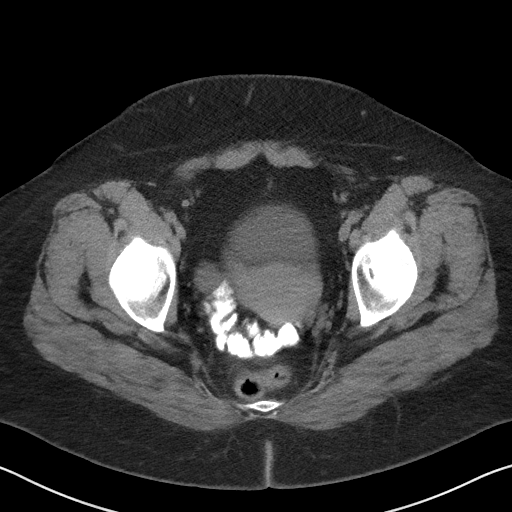
[im 36/111  soft-tissue]
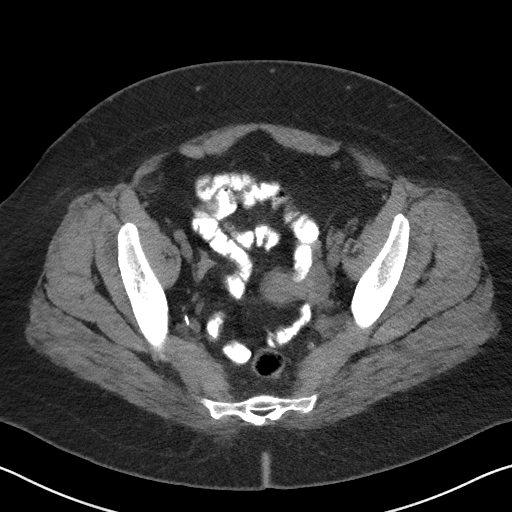
[im 45/111  soft-tissue]
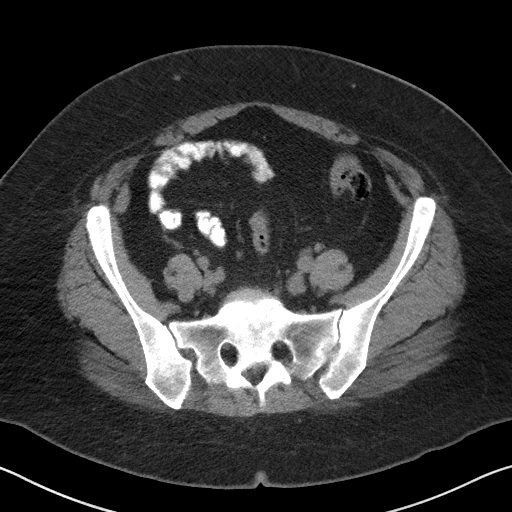
[im 53/111  soft-tissue]
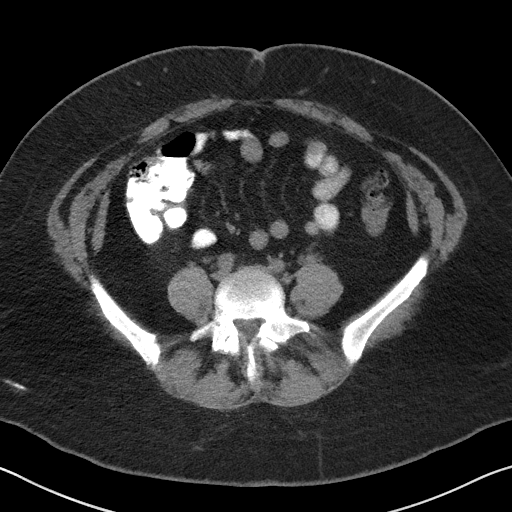
[im 58/111  soft-tissue]
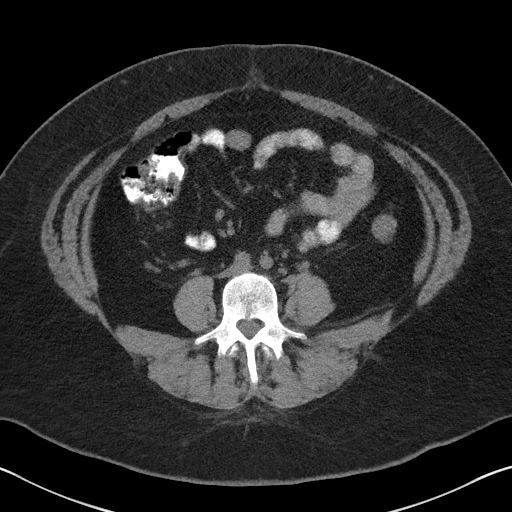
[im 67/111  soft-tissue]
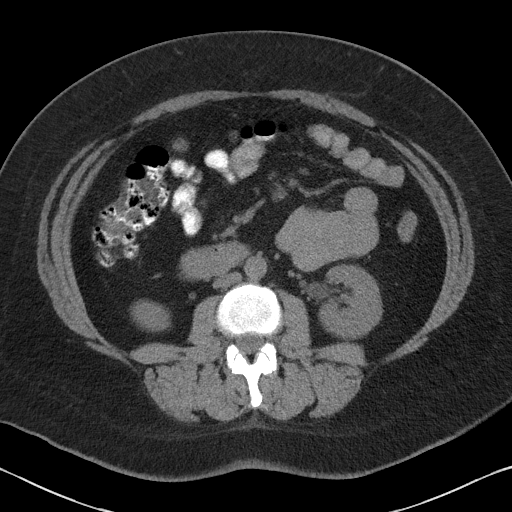
[im 67/111  bone]
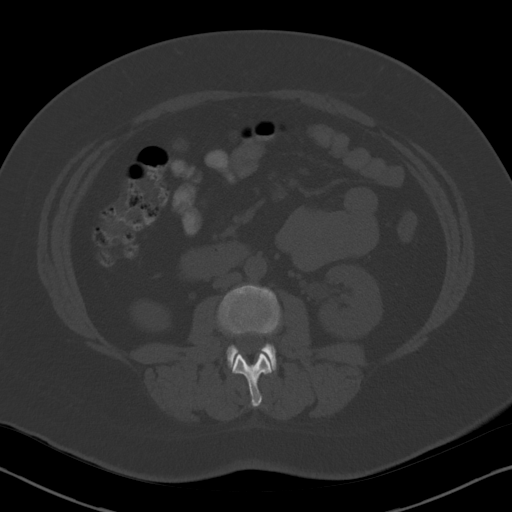
[im 75/111  soft-tissue]
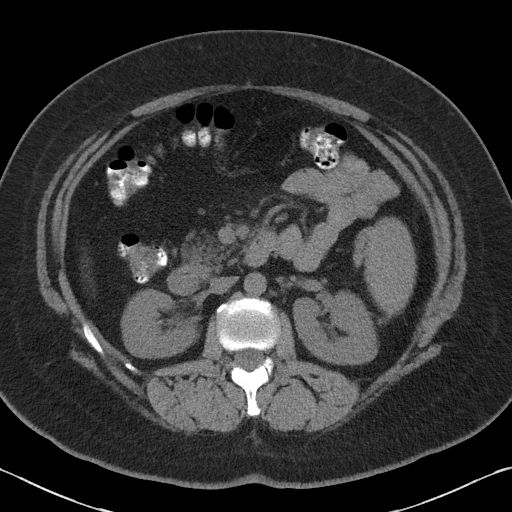
[im 84/111  soft-tissue]
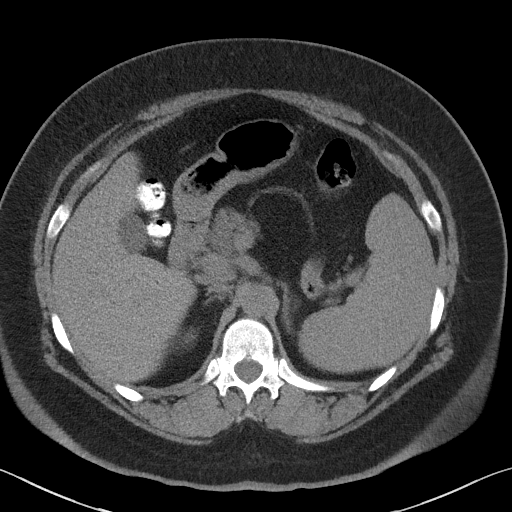
[im 89/111  soft-tissue]
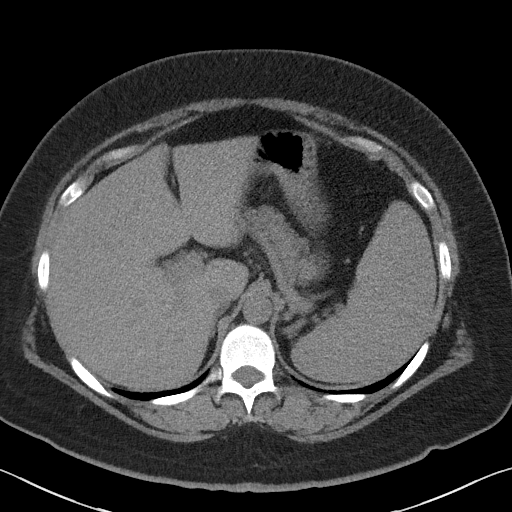
[im 97/111  soft-tissue]
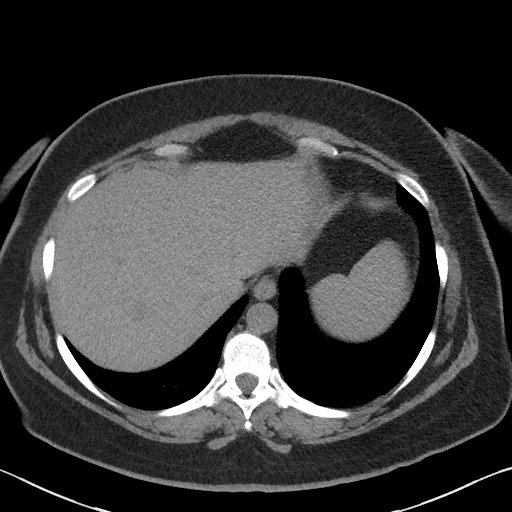
[im 106/111  soft-tissue]
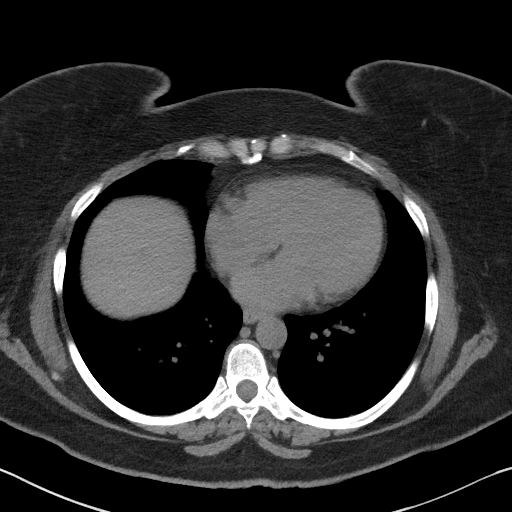

[Series 5: coronal st · coronal · 1.05mm/px · 3 of 107 slices shown]
[im 36/107  soft-tissue]
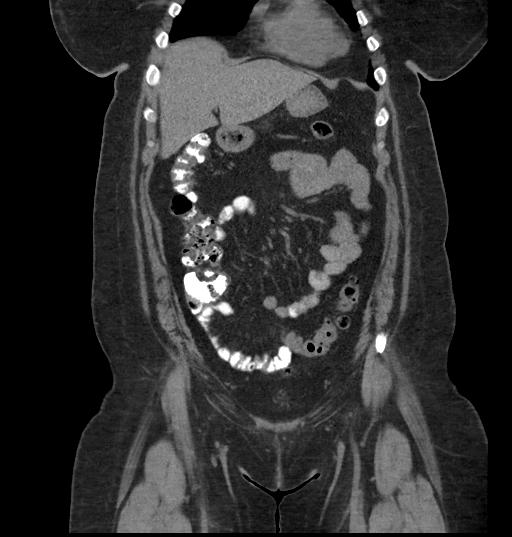
[im 48/107  soft-tissue]
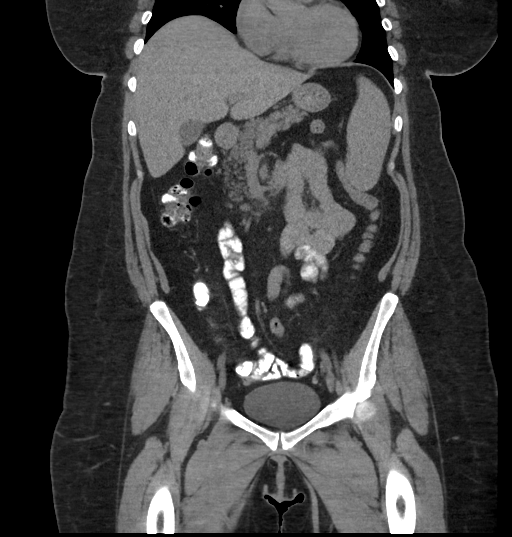
[im 59/107  soft-tissue]
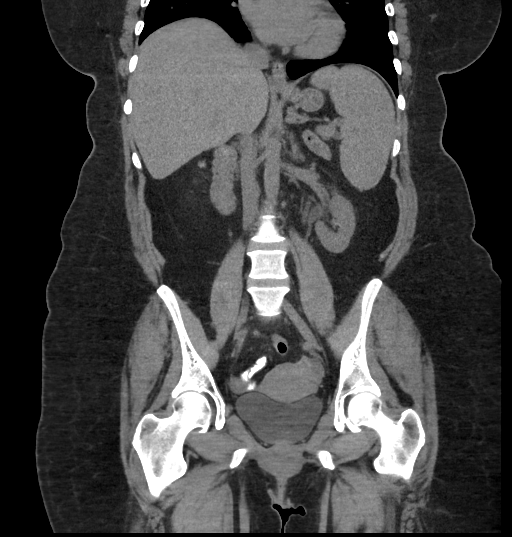

[17 of 46 positions shown; findings below may reference images not displayed]

FINDINGS: Lower chest: Small subpleural nodule in the right lower lobe
measures 4 mm.There is no pleural or pericardial effusion.

Hepatobiliary: No focal liver abnormality is seen. No gallstones,
gallbladder wall thickening, or biliary dilatation.

Pancreas: Unremarkable. No pancreatic ductal dilatation or
surrounding inflammatory changes.

Spleen: The spleen measures  6.2 x 14.1 x13 cm (volume = 600 cm^3)

Adrenals/Urinary Tract: The adrenal glands are normal. Unremarkable
appearance of both kidneys. No mass or hydronephrosis.

Stomach/Bowel: The stomach appears normal. The small bowel loops
have a normal course and caliber. The appendix is visualized and
appears normal. Distal colonic diverticula identified without acute
inflammation.

Vascular/Lymphatic: Normal appearance of the abdominal aorta. No
enlarged retroperitoneal or mesenteric adenopathy. No enlarged
pelvic or inguinal lymph nodes.

Reproductive: Uterus and bilateral adnexa are unremarkable.

Other: No free fluid or fluid collections identified.

Musculoskeletal: No aggressive lytic or sclerotic bone lesions.
IMPRESSION: 1. No acute findings identified within the abdomen or pelvis.
2. Splenomegaly.

## 2018-09-12 ENCOUNTER — Other Ambulatory Visit: Payer: Self-pay | Admitting: Physician Assistant

## 2018-09-12 MED ORDER — OSELTAMIVIR PHOSPHATE 75 MG PO CAPS: 75.0000 mg | ORAL_CAPSULE | Freq: Every day | ORAL | 0 refills | Status: DC

## 2018-09-12 MED FILL — OSELTAMIVIR PHOSPHATE 75 MG: 75 | 10 days supply | Qty: 10 | Fill #0

## 2018-10-09 ENCOUNTER — Other Ambulatory Visit: Payer: Self-pay

## 2018-10-09 MED ORDER — VALACYCLOVIR HCL 1 G PO TABS: 2000.0000 mg | ORAL_TABLET | Freq: Two times a day (BID) | ORAL | 5 refills | Status: DC

## 2018-11-06 ENCOUNTER — Ambulatory Visit: Payer: Self-pay | Admitting: Adult Health

## 2018-11-06 ENCOUNTER — Other Ambulatory Visit: Payer: Self-pay

## 2018-11-19 ENCOUNTER — Other Ambulatory Visit: Payer: Self-pay

## 2018-11-19 ENCOUNTER — Telehealth: Payer: Self-pay | Admitting: Family Medicine

## 2019-01-01 ENCOUNTER — Encounter: Payer: Self-pay | Admitting: Physician Assistant

## 2019-01-01 ENCOUNTER — Ambulatory Visit (INDEPENDENT_AMBULATORY_CARE_PROVIDER_SITE_OTHER): Payer: No Typology Code available for payment source | Admitting: Physician Assistant

## 2019-01-01 VITALS — BP 139/79 | HR 71 | Temp 98.0°F | Ht 64.0 in | Wt 243.0 lb

## 2019-01-01 DIAGNOSIS — N62 Hypertrophy of breast: Secondary | ICD-10-CM | POA: Diagnosis not present

## 2019-01-01 DIAGNOSIS — L237 Allergic contact dermatitis due to plants, except food: Secondary | ICD-10-CM | POA: Diagnosis not present

## 2019-01-01 MED ORDER — TRIAMCINOLONE ACETONIDE 0.1 % EX CREA: 1.0000 "application " | TOPICAL_CREAM | Freq: Two times a day (BID) | CUTANEOUS | 0 refills | Status: DC

## 2019-01-01 MED ORDER — METHYLPREDNISOLONE SODIUM SUCC 125 MG IJ SOLR
125.0000 mg | Freq: Once | INTRAMUSCULAR | Status: AC
  Administered 2019-01-01: 125 mg via INTRAMUSCULAR

## 2019-01-01 MED ORDER — LIRAGLUTIDE -WEIGHT MANAGEMENT 18 MG/3ML ~~LOC~~ SOPN: 0.6000 mg | PEN_INJECTOR | Freq: Every day | SUBCUTANEOUS | 1 refills | Status: DC

## 2019-01-01 MED ORDER — INSULIN PEN NEEDLE 32G X 6 MM MISC: 0 refills | Status: DC

## 2019-01-01 MED ORDER — METHYLPREDNISOLONE ACETATE 80 MG/ML IJ SUSP
80.0000 mg | Freq: Once | INTRAMUSCULAR | Status: AC
  Administered 2019-01-01: 80 mg via INTRAMUSCULAR

## 2019-01-01 MED FILL — TRIAMCINOLONE ACETONIDE 0.1: 0.1 | 30 days supply | Qty: 80 | Fill #0

## 2019-01-01 NOTE — Progress Notes (Signed)
Subjective:    Patient ID: Regina Mcconnell, female    DOB: Jan 19, 1971, 48 y.o.   MRN: [id number]  HPI Pt is a 48 yo morbidly femalemale with hx of poison ivy dermatitis who presents to the clinic with itchy rash after working in the yard this weekend. Per patient she has needed multiple shots in the past to get rid of itchy rash. She wants to get ahead of this time. She has not done anything to make better at this point. Rash is on bilateral forearms and calves.   She is also frustrated with weight. She continues to gain. She admits she is not exercising like she should. She has tried phentermine in the past and had good results but gained it back when she stopped. She is interested in other options.   .. Active Ambulatory Problems    Diagnosis Date Noted  . Acute low back pain with disc symptoms, duration less than 6 weeks 06/03/2012  . Family history of early CAD 07/23/2012  . Family history of diabetes mellitus 07/23/2012  . Heart murmur 07/23/2012  . Poison ivy dermatitis 01/02/2013  . Elevated liver enzymes 05/23/2017  . Pain of upper abdomen 05/23/2017  . Spleen enlarged 05/23/2017  . Pulmonary nodule 06/19/2017  . Class 3 severe obesity due to excess calories without serious comorbidity with body mass index (BMI) of 40.0 to 44.9 in adult ([place]) 02/05/2018  . B12 nutritional deficiency 02/06/2018  . Morbid obesity ([place]) 01/01/2019  . Large breasts 01/01/2019   Resolved Ambulatory Problems    Diagnosis Date Noted  . No Resolved Ambulatory Problems   Past Medical History:  Diagnosis Date  . Abnormal Pap smear of cervix 1995  . Abnormal uterine bleeding   . Urinary incontinence       Review of Systems See HPI.     Objective:   Physical Exam Vitals signs reviewed.  Constitutional:      Appearance: Normal appearance. She is obese.  HENT:     Head: Normocephalic and atraumatic.  Cardiovascular:     Rate and Rhythm: Normal rate and regular rhythm.  Pulmonary:     Effort:  Pulmonary effort is normal.     Breath sounds: Normal breath sounds.     Comments: Large breast Skin:    General: Skin is warm.     Comments: Scattered erythematous Papular erythematous rash on bilateral forearms and calves.    Neurological:     General: No focal deficit present.     Mental Status: She is alert and oriented to person, place, and time.  Psychiatric:        Mood and Affect: Mood normal.        Behavior: Behavior normal.           Assessment & Plan:  Marland KitchenMarland KitchenEvelean was seen today for poison oak.  Diagnoses and all orders for this visit:  Poison ivy dermatitis -     triamcinolone cream (KENALOG) 0.1 %; Apply 1 application topically 2 (two) times daily. -     methylPREDNISolone acetate (DEPO-MEDROL) injection 80 mg -     methylPREDNISolone sodium succinate (SOLU-MEDROL) 125 mg/2 mL injection 125 mg  Morbid obesity (HCC) -     Liraglutide -Weight Management (SAXENDA) 18 MG/3ML SOPN; Inject 0.6 mg into the skin daily. For one week then increase by .6mg  weekly until reaches 3mg  daily.  Please include ultra fine needles 67mm -     Insulin Pen Needle (NOVOFINE) 32G X 6 MM MISC; Use  with saxenda pen once daily.  Large breasts  treated with IM solumedrol and depomedrol today. Triamcinolone for topical itchy areas. Follow up as needed.   Marland Kitchen.Discussed low carb diet with 1500 calories and 80g of protein.  Exercising at least 150 minutes a week.  [employer] could be a Microbiologist.  Discussed medication options.  Phentermine tried but gained weight once stopped.  Started saxenda. Discussed side effects and how to titrate. Follow up in 6 weeks after starting. Coupon card given.   Pt does have large breast and I think she should consider breast reduction for weight and mobility purposes.   Marland Kitchen.Spent 30 minutes with patient and greater than 50 percent of visit spent counseling patient regarding treatment plan.

## 2019-01-01 NOTE — Patient Instructions (Signed)
Poison Ivy Dermatitis  Poison ivy dermatitis is redness and soreness (inflammation) of the skin. It is caused by a chemical that is found on the leaves of the poison ivy plant. You may also have itching, a rash, and blisters. Symptoms often clear up in 1-2 weeks. You may get this condition by touching a poison ivy plant. You can also get it by touching something that has the chemical on it. This may include animals or objects that have come in contact with the plant. Follow these instructions at home: General instructions  Take or apply over-the-counter and prescription medicines only as told by your doctor.  If you touch poison ivy, wash your skin with soap and cold water right away.  Use hydrocortisone creams or calamine lotion as needed to help with itching.  Take oatmeal baths as needed. Use colloidal oatmeal. You can get this at a pharmacy or grocery store. Follow the instructions on the package.  Do not scratch or rub your skin.  While you have the rash, wash your clothes right after you wear them. Prevention   Know what poison ivy looks like so you can avoid it. This plant has three leaves with flowering branches on a single stem. The leaves are glossy. They have uneven edges that come to a point at the front.  If you have touched poison ivy, wash with soap and water right away. Be sure to wash under your fingernails.  When hiking or camping, wear long pants, a long-sleeved shirt, tall socks, and hiking boots. You can also use a lotion on your skin that helps to prevent contact with the chemical on the plant.  If you think that your clothes or outdoor gear came in contact with poison ivy, rinse them off with a garden hose before you bring them inside your house. Contact a doctor if:  You have open sores in the rash area.  You have more redness, swelling, or pain in the affected area.  You have redness that spreads beyond the rash area.  You have fluid, blood, or pus coming  from the affected area.  You have a fever.  You have a rash over a large area of your body.  You have a rash on your eyes, mouth, or genitals.  Your rash does not get better after a few days. Get help right away if:  Your face swells or your eyes swell shut.  You have trouble breathing.  You have trouble swallowing. This information is not intended to replace advice given to you by your health care provider. Make sure you discuss any questions you have with your health care provider. Document Released: 09/16/2010 Document Revised: 05/08/2018 Document Reviewed: 01/20/2015 Elsevier Interactive Patient Education  2019 [employer].

## 2019-01-02 ENCOUNTER — Telehealth: Payer: Self-pay | Admitting: Physician Assistant

## 2019-01-02 NOTE — Telephone Encounter (Signed)
Received fax from [employer] that Saxenda requires a PA. Information has been sent to the insurance company. Awaiting determination.   

## 2019-01-07 NOTE — Telephone Encounter (Signed)
Ok thanks. Yes we can do contrave if she wants it and then if results are not desirable then switch to saxenda.

## 2019-01-07 NOTE — Telephone Encounter (Signed)
Received a fax from [employer] that patient will have to try and fail Contrave before she can get on Saxenda. I do not see that she has been on Contrave before. I have sent a message to the patient to let her know. Please advise.

## 2019-01-08 MED ORDER — NALTREXONE-BUPROPION HCL ER 8-90 MG PO TB12: ORAL_TABLET | ORAL | 0 refills | Status: DC

## 2019-01-08 NOTE — Telephone Encounter (Signed)
Insurance would not pay for saxenda without trial of contrave. Sent starter pack. Discussed with patient how to titrate up. Discussed side effects. No hx of seizure.

## 2019-01-09 NOTE — Telephone Encounter (Signed)
Waiting on response from insurance for Contrave. Patient is ok with starting this medication per Provider recommendation.

## 2019-01-10 MED FILL — CONTRAVE ER 8-90 MG TABLET: 8-90 | 30 days supply | Qty: 70 | Fill #0

## 2019-01-13 NOTE — Telephone Encounter (Signed)
Received fax from [employer] that [facility] has been approved from 01/10/2019 through 05/02/2019. Pharmacy aware and form sent to scan.

## 2019-03-17 ENCOUNTER — Encounter: Payer: Self-pay | Admitting: Obstetrics and Gynecology

## 2019-03-25 ENCOUNTER — Encounter: Payer: Self-pay | Admitting: Physician Assistant

## 2019-03-25 ENCOUNTER — Ambulatory Visit (INDEPENDENT_AMBULATORY_CARE_PROVIDER_SITE_OTHER): Payer: No Typology Code available for payment source | Admitting: Physician Assistant

## 2019-03-25 VITALS — BP 132/68 | HR 83

## 2019-03-25 DIAGNOSIS — L237 Allergic contact dermatitis due to plants, except food: Secondary | ICD-10-CM | POA: Diagnosis not present

## 2019-03-25 MED ORDER — METHYLPREDNISOLONE ACETATE 80 MG/ML IJ SUSP
80.0000 mg | Freq: Once | INTRAMUSCULAR | Status: AC
  Administered 2019-03-25: 80 mg via INTRAMUSCULAR

## 2019-03-25 MED ORDER — METHYLPREDNISOLONE SODIUM SUCC 125 MG IJ SOLR
125.0000 mg | Freq: Once | INTRAMUSCULAR | Status: AC
  Administered 2019-03-25: 125 mg via INTRAMUSCULAR

## 2019-03-25 NOTE — Progress Notes (Signed)
   Subjective:    Patient ID: Regina Mcconnell, female    DOB: [birth year], 48 y.o.   MRN: [id number]  HPI  Pt is a 48 yo female with hx of poison ivy dermitis who presents to the clinic with itchy raised vesicular rash over bilateral arms and legs that started 2 days ago. This weekend she did work out in the yard and cut bushes down. She tried to stay clean and war long pants and sleeves. She has used some topical steroid which does help but not clearing it up. No SOB, wheezing, cough, SOB.   Marland Kitchen Active Ambulatory Problems    Diagnosis Date Noted  . Acute low back pain with disc symptoms, duration less than 6 weeks 06/03/2012  . Family history of early CAD 07/23/2012  . Family history of diabetes mellitus 07/23/2012  . Heart murmur 07/23/2012  . Poison ivy dermatitis 01/02/2013  . Elevated liver enzymes 05/23/2017  . Pain of upper abdomen 05/23/2017  . Spleen enlarged 05/23/2017  . Pulmonary nodule 06/19/2017  . Class 3 severe obesity due to excess calories without serious comorbidity with body mass index (BMI) of 40.0 to 44.9 in adult ([place]) 02/05/2018  . B12 nutritional deficiency 02/06/2018  . Morbid obesity ([place]) 01/01/2019  . Large breasts 01/01/2019   Resolved Ambulatory Problems    Diagnosis Date Noted  . No Resolved Ambulatory Problems   Past Medical History:  Diagnosis Date  . Abnormal Pap smear of cervix 1995  . Abnormal uterine bleeding   . Urinary incontinence        Review of Systems See HPI.     Objective:   Physical Exam Vitals signs reviewed.  Constitutional:      Appearance: Normal appearance.  Cardiovascular:     Rate and Rhythm: Normal rate and regular rhythm.  Pulmonary:     Effort: Pulmonary effort is normal.     Breath sounds: Normal breath sounds.  Skin:    Comments: Bilateral upper and lower extremities raised erythematous whelps and other linear vesicles scattered. Some appear scabbed over and other fresh and open. At times they do leak.    Neurological:     General: No focal deficit present.     Mental Status: She is alert and oriented to person, place, and time.  Psychiatric:        Mood and Affect: Mood normal.        Behavior: Behavior normal.           Assessment & Plan:  Marland KitchenMarland KitchenChivon was seen today for rash.  Diagnoses and all orders for this visit:  Poison ivy dermatitis -     methylPREDNISolone acetate (DEPO-MEDROL) injection 80 mg -     methylPREDNISolone sodium succinate (SOLU-MEDROL) 125 mg/2 mL injection 125 mg   Given 2 IM injections. Continue to use topical. Benadryl for itching. Cool compresses and oatmeal baths. Follow up as needed. Could add oral prednisone if not improving over next few days.

## 2019-03-28 ENCOUNTER — Other Ambulatory Visit: Payer: Self-pay

## 2019-03-28 NOTE — Progress Notes (Signed)
GYNECOLOGY  VISIT   HPI: 48 y.o.   Married  Caucasian  female   G1P1001 with No LMP recorded. Patient has had an ablation.   here to discuss prolapse and surgical repair.   Has a history of third degree cystocele, first to second degree uterine prolapse, and first to second degree rectocele.   Hx of incontinence.   Has used a #4 incontinence dish pessary.  Not used in about one year.   Now leaking with sneeze, cough, exercise.  States she can feel the prolapse.   Some urgency and frequency.  DF - can be every 10 minutes if she is drinking water.  NF -  1 - 2 times.   Voids well.   No UTIs.  No renal stones or hematuria.   BMs.  Does vaginal splinting 2 times a week to have a BM.  No fecal incontinence.   I delivered her daughter who just graduated from HS.   GYNECOLOGIC HISTORY: No LMP recorded. Patient has had an ablation. Contraception:  Ablation  Menopausal hormone therapy: none Last mammogram: 02-27-18 density B/BIRADS 1 negative, scheduled 05-14-2019 Last pap smear:   08-06-15 negative, HR HPV negative         OB History    Gravida  1   Para  1   Term  1   Preterm      AB      Living  1     SAB      TAB      Ectopic      Multiple      Live Births                 Patient Active Problem List   Diagnosis Date Noted  . Morbid obesity ([place]) 01/01/2019  . Large breasts 01/01/2019  . B12 nutritional deficiency 02/06/2018  . Class 3 severe obesity due to excess calories without serious comorbidity with body mass index (BMI) of 40.0 to 44.9 in adult ([place]) 02/05/2018  . Pulmonary nodule 06/19/2017  . Elevated liver enzymes 05/23/2017  . Pain of upper abdomen 05/23/2017  . Spleen enlarged 05/23/2017  . Poison ivy dermatitis 01/02/2013  . Family history of early CAD 07/23/2012  . Family history of diabetes mellitus 07/23/2012  . Heart murmur 07/23/2012  . Acute low back pain with disc symptoms, duration less than 6 weeks 06/03/2012    Past  Medical History:  Diagnosis Date  . Abnormal Pap smear of cervix 1995   --hx colposcopy w/bx and cryotherapy of cervix(paps normal since)  . Abnormal uterine bleeding    reason for ablation in 2009  . Heart murmur   . Urinary incontinence    especially with exercise    Past Surgical History:  Procedure Laterality Date  . DILATION AND CURETTAGE OF UTERUS     due to heavy cycles  . ENDOMETRIAL ABLATION  2009   Dr. Quincy Simmonds    Current Outpatient Medications  Medication Sig Dispense Refill  . triamcinolone cream (KENALOG) 0.1 % Apply 1 application topically 2 (two) times daily. 80 g 0  . valACYclovir (VALTREX) 1000 MG tablet Take 2 tablets (2,000 mg total) by mouth 2 (two) times daily. For one day for outbreaks. 12 tablet 5   No current facility-administered medications for this visit.      ALLERGIES: Patient has no known allergies.  Family History  Problem Relation Age of Onset  . Diabetes Mother   . Thyroid disease Mother   . Heart  disease Father   . Heart disease Paternal Uncle        all deceased from heart attacks  . Stroke Paternal Grandmother   . Breast cancer Neg Hx     Social History   Socioeconomic History  . Marital status: Married    Spouse name: Francee Piccolo  . Number of children: 1  . Years of education: Assoc degr  . Highest education level: Not on file  Occupational History  . Occupation: Counsellor: [employer]  . Financial resource strain: Not on file  . Food insecurity    Worry: Not on file    Inability: Not on file  . Transportation needs    Medical: Not on file    Non-medical: Not on file  Tobacco Use  . Smoking status: Never Smoker  . Smokeless tobacco: Never Used  Substance and Sexual Activity  . Alcohol use: Yes    Alcohol/week: 0.0 standard drinks    Comment: once or twice a year  . Drug use: No  . Sexual activity: Yes    Partners: Male    Birth control/protection: None  Lifestyle  . Physical activity     Days per week: Not on file    Minutes per session: Not on file  . Stress: Not on file  Relationships  . Social Herbalist on phone: Not on file    Gets together: Not on file    Attends religious service: Not on file    Active member of club or organization: Not on file    Attends meetings of clubs or organizations: Not on file    Relationship status: Not on file  . Intimate partner violence    Fear of current or ex partner: Not on file    Emotionally abused: Not on file    Physically abused: Not on file    Forced sexual activity: Not on file  Other Topics Concern  . Not on file  Social History Narrative   Working out 1 hour a day.  1-2 caffeine drinks per day.      Review of Systems  Constitutional: Negative.   HENT: Negative.   Eyes: Negative.   Respiratory: Negative.   Cardiovascular: Negative.   Gastrointestinal: Negative.   Endocrine: Negative.   Genitourinary:       Prolapse Incontinence   Musculoskeletal: Negative.   Skin: Negative.   Allergic/Immunologic: Negative.   Neurological: Negative.   Hematological: Negative.   Psychiatric/Behavioral: Negative.     PHYSICAL EXAMINATION:    BP 132/88 (BP Location: Left Arm, Patient Position: Sitting, Cuff Size: Large)   Pulse 86   Temp 97.7 F (36.5 C) (Skin)   Resp 14   Ht 5\' 3"  (1.6 m)   Wt 240 lb 4 oz (109 kg)   BMI 42.56 kg/m     General appearance: alert, cooperative and appears stated age   Abdomen: soft, non-tender, no masses,  no organomegaly    Pelvic: External genitalia:  no lesions              Urethra:  normal appearing urethra with no masses, tenderness or lesions              Bartholins and Skenes: normal                 Vagina: normal appearing vagina with normal color and discharge, no lesions  Third degree cystocele, almost second degree uterine prolapse, first  degree rectocele.               Cervix: no lesions                Bimanual Exam:  Uterus:  normal size, contour,  position, consistency, mobility, non-tender              Adnexa: no mass, fullness, tenderness              Rectal exam: Yes.  .  Confirms.              Anus:  normal sphincter tone, no lesions  Chaperone was present for exam.  ASSESSMENT  Incomplete uterovaginal prolapse.  Genuine stress incontinence.  Urinary urgency.  BMI 42.56.  PLAN  We discussed prolapse and incontinence.  We focused on surgical correction including hysterectomy with bilateral salpingectomy, vaginal vault suspension, anterior and posterior colporrhaphy, and midurethral sling.  I described different routes of surgery including vaginal, abdominal and laparoscopic robotic.   If possible, a robotic approach for sacrocolpopexy would give her benefit of fast recovery and longevity of the prolapse repair.  She will need urodynamic testing with reduction of prolapse in preparation for surgery.  I will make a referral to Dr. Maryland Pink with [facility] for further evaluation and treatment.  ACOG HOs on prolapse and incontinence and surgical repair for prolapse and incontinence to patient.    An After Visit Summary was printed and given to the patient.  _30____ minutes face to face time of which over 50% was spent in counseling.

## 2019-03-31 ENCOUNTER — Other Ambulatory Visit: Payer: Self-pay | Admitting: Physician Assistant

## 2019-03-31 DIAGNOSIS — Z1231 Encounter for screening mammogram for malignant neoplasm of breast: Secondary | ICD-10-CM

## 2019-04-01 ENCOUNTER — Ambulatory Visit (INDEPENDENT_AMBULATORY_CARE_PROVIDER_SITE_OTHER): Payer: No Typology Code available for payment source | Admitting: Obstetrics and Gynecology

## 2019-04-01 ENCOUNTER — Encounter: Payer: Self-pay | Admitting: Obstetrics and Gynecology

## 2019-04-01 ENCOUNTER — Other Ambulatory Visit: Payer: Self-pay

## 2019-04-01 VITALS — BP 132/88 | HR 86 | Temp 97.7°F | Resp 14 | Ht 63.0 in | Wt 240.2 lb

## 2019-04-01 DIAGNOSIS — N812 Incomplete uterovaginal prolapse: Secondary | ICD-10-CM | POA: Insufficient documentation

## 2019-04-01 DIAGNOSIS — N393 Stress incontinence (female) (male): Secondary | ICD-10-CM | POA: Insufficient documentation

## 2019-04-01 NOTE — Patient Instructions (Addendum)
We will call you with an appointment date and time to see Dr. Zigmund Daniel.

## 2019-04-29 ENCOUNTER — Telehealth: Payer: Self-pay | Admitting: Obstetrics and Gynecology

## 2019-04-29 ENCOUNTER — Encounter: Payer: Self-pay | Admitting: Obstetrics and Gynecology

## 2019-04-29 NOTE — Telephone Encounter (Signed)
Patient sent the following message through [facility].   [place] [facility]  Phone Number: [phone]        Dr. Quincy Simmonds,   I still have not received a call from Dr. Zigmund Daniel office to schedule my consult appointment. Can you check on this referral for me? Thanks so much!!

## 2019-04-29 NOTE — Telephone Encounter (Signed)
Left voicemail regarding referral appointment. The information is listed below. Should the patient need to cancel or reschedule this appointment, Please advise them to call the office they've been referred to in order to reschedule.  DR. Barnetta Chapel [facility] [place]  Phone: [phone]  Dr. Zigmund Daniel 06/13/19 @ 8:00 am. Please arrive 15 minutes early and bring your insurance card and photo id and list of medications.

## 2019-04-30 ENCOUNTER — Encounter: Payer: Self-pay | Admitting: Physician Assistant

## 2019-04-30 ENCOUNTER — Other Ambulatory Visit: Payer: Self-pay

## 2019-04-30 ENCOUNTER — Ambulatory Visit (INDEPENDENT_AMBULATORY_CARE_PROVIDER_SITE_OTHER): Payer: No Typology Code available for payment source | Admitting: Physician Assistant

## 2019-04-30 VITALS — BP 122/82 | HR 79 | Temp 97.9°F | Ht 64.0 in | Wt 241.0 lb

## 2019-04-30 DIAGNOSIS — Z131 Encounter for screening for diabetes mellitus: Secondary | ICD-10-CM

## 2019-04-30 DIAGNOSIS — E559 Vitamin D deficiency, unspecified: Secondary | ICD-10-CM | POA: Diagnosis not present

## 2019-04-30 DIAGNOSIS — Z13220 Encounter for screening for lipoid disorders: Secondary | ICD-10-CM | POA: Diagnosis not present

## 2019-04-30 DIAGNOSIS — Z0000 Encounter for general adult medical examination without abnormal findings: Secondary | ICD-10-CM | POA: Diagnosis not present

## 2019-04-30 NOTE — Patient Instructions (Signed)
Health Maintenance, Female Adopting a healthy lifestyle and getting preventive care are important in promoting health and wellness. Ask your health care provider about:  The right schedule for you to have regular tests and exams.  Things you can do on your own to prevent diseases and keep yourself healthy. What should I know about diet, weight, and exercise? Eat a healthy diet   Eat a diet that includes plenty of vegetables, fruits, low-fat dairy products, and lean protein.  Do not eat a lot of foods that are high in solid fats, added sugars, or sodium. Maintain a healthy weight Body mass index (BMI) is used to identify weight problems. It estimates body fat based on height and weight. Your health care provider can help determine your BMI and help you achieve or maintain a healthy weight. Get regular exercise Get regular exercise. This is one of the most important things you can do for your health. Most adults should:  Exercise for at least 150 minutes each week. The exercise should increase your heart rate and make you sweat (moderate-intensity exercise).  Do strengthening exercises at least twice a week. This is in addition to the moderate-intensity exercise.  Spend less time sitting. Even light physical activity can be beneficial. Watch cholesterol and blood lipids Have your blood tested for lipids and cholesterol at 48 years of age, then have this test every 5 years. Have your cholesterol levels checked more often if:  Your lipid or cholesterol levels are high.  You are older than 48 years of age.  You are at high risk for heart disease. What should I know about cancer screening? Depending on your health history and family history, you may need to have cancer screening at various ages. This may include screening for:  Breast cancer.  Cervical cancer.  Colorectal cancer.  Skin cancer.  Lung cancer. What should I know about heart disease, diabetes, and high blood  pressure? Blood pressure and heart disease  High blood pressure causes heart disease and increases the risk of stroke. This is more likely to develop in people who have high blood pressure readings, are of African descent, or are overweight.  Have your blood pressure checked: ? Every 3-5 years if you are 18-39 years of age. ? Every year if you are 40 years old or older. Diabetes Have regular diabetes screenings. This checks your fasting blood sugar level. Have the screening done:  Once every three years after age 40 if you are at a normal weight and have a low risk for diabetes.  More often and at a younger age if you are overweight or have a high risk for diabetes. What should I know about preventing infection? Hepatitis B If you have a higher risk for hepatitis B, you should be screened for this virus. Talk with your health care provider to find out if you are at risk for hepatitis B infection. Hepatitis C Testing is recommended for:  Everyone born from 1945 through 1965.  Anyone with known risk factors for hepatitis C. Sexually transmitted infections (STIs)  Get screened for STIs, including gonorrhea and chlamydia, if: ? You are sexually active and are younger than 48 years of age. ? You are older than 48 years of age and your health care provider tells you that you are at risk for this type of infection. ? Your sexual activity has changed since you were last screened, and you are at increased risk for chlamydia or gonorrhea. Ask your health care provider if   you are at risk.  Ask your health care provider about whether you are at high risk for HIV. Your health care provider may recommend a prescription medicine to help prevent HIV infection. If you choose to take medicine to prevent HIV, you should first get tested for HIV. You should then be tested every 3 months for as long as you are taking the medicine. Pregnancy  If you are about to stop having your period (premenopausal) and  you may become pregnant, seek counseling before you get pregnant.  Take 400 to 800 micrograms (mcg) of folic acid every day if you become pregnant.  Ask for birth control (contraception) if you want to prevent pregnancy. Osteoporosis and menopause Osteoporosis is a disease in which the bones lose minerals and strength with aging. This can result in bone fractures. If you are 65 years old or older, or if you are at risk for osteoporosis and fractures, ask your health care provider if you should:  Be screened for bone loss.  Take a calcium or vitamin D supplement to lower your risk of fractures.  Be given hormone replacement therapy (HRT) to treat symptoms of menopause. Follow these instructions at home: Lifestyle  Do not use any products that contain nicotine or tobacco, such as cigarettes, e-cigarettes, and chewing tobacco. If you need help quitting, ask your health care provider.  Do not use street drugs.  Do not share needles.  Ask your health care provider for help if you need support or information about quitting drugs. Alcohol use  Do not drink alcohol if: ? Your health care provider tells you not to drink. ? You are pregnant, may be pregnant, or are planning to become pregnant.  If you drink alcohol: ? Limit how much you use to 0-1 drink a day. ? Limit intake if you are breastfeeding.  Be aware of how much alcohol is in your drink. In the [place] one drink equals one 12 oz bottle of beer (355 mL), one 5 oz glass of wine (148 mL), or one 1 oz glass of hard liquor (44 mL). General instructions  Schedule regular health, dental, and eye exams.  Stay current with your vaccines.  Tell your health care provider if: ? You often feel depressed. ? You have ever been abused or do not feel safe at home. Summary  Adopting a healthy lifestyle and getting preventive care are important in promoting health and wellness.  Follow your health care provider's instructions about healthy  diet, exercising, and getting tested or screened for diseases.  Follow your health care provider's instructions on monitoring your cholesterol and blood pressure. This information is not intended to replace advice given to you by your health care provider. Make sure you discuss any questions you have with your health care provider. Document Released: 02/27/2011 Document Revised: 08/07/2018 Document Reviewed: 08/07/2018 Elsevier Patient Education  2020 Elsevier Inc.  

## 2019-04-30 NOTE — Progress Notes (Signed)
Subjective:     Regina Mcconnell is a 48 y.o. female and is here for a comprehensive physical exam. The patient reports no problems.  Social History   Socioeconomic History  . Marital status: Married    Spouse name: Regina Mcconnell  . Number of children: 1  . Years of education: Assoc degr  . Highest education level: Not on file  Occupational History  . Occupation: Counsellor: [employer]  . Financial resource strain: Not on file  . Food insecurity    Worry: Not on file    Inability: Not on file  . Transportation needs    Medical: Not on file    Non-medical: Not on file  Tobacco Use  . Smoking status: Never Smoker  . Smokeless tobacco: Never Used  Substance and Sexual Activity  . Alcohol use: Yes    Alcohol/week: 0.0 standard drinks    Comment: once or twice a year  . Drug use: No  . Sexual activity: Yes    Partners: Male    Birth control/protection: None  Lifestyle  . Physical activity    Days per week: Not on file    Minutes per session: Not on file  . Stress: Not on file  Relationships  . Social Herbalist on phone: Not on file    Gets together: Not on file    Attends religious service: Not on file    Active member of club or organization: Not on file    Attends meetings of clubs or organizations: Not on file    Relationship status: Not on file  . Intimate partner violence    Fear of current or ex partner: Not on file    Emotionally abused: Not on file    Physically abused: Not on file    Forced sexual activity: Not on file  Other Topics Concern  . Not on file  Social History Narrative   Working out 1 hour a day.  1-2 caffeine drinks per day.     Health Maintenance  Topic Date Due  . MAMMOGRAM  02/28/2019  . PAP SMEAR-Modifier  04/30/2019 (Originally 08/05/2018)  . INFLUENZA VACCINE  05/28/2019 (Originally 03/29/2019)  . HIV Screening  04/29/2020 (Originally 05/08/1986)  . TETANUS/TDAP  02/08/2022    The following  portions of the patient's history were reviewed and updated as appropriate: allergies, current medications, past family history, past medical history, past social history, past surgical history and problem list.  Review of Systems A comprehensive review of systems was negative.   Objective:    BP 122/82   Pulse 79   Temp 97.9 F (36.6 C) (Oral)   Ht 5\' 4"  (1.626 m)   Wt 241 lb (109.3 kg)   SpO2 100%   BMI 41.37 kg/m  General appearance: alert, cooperative, appears stated age and morbidly obese Head: Normocephalic, without obvious abnormality, atraumatic Eyes: conjunctivae/corneas clear. PERRL, EOM's intact. Fundi benign. Ears: normal TM's and external ear canals both ears Nose: Nares normal. Septum midline. Mucosa normal. No drainage or sinus tenderness. Throat: lips, mucosa, and tongue normal; teeth and gums normal Neck: no adenopathy, no carotid bruit, no JVD, supple, symmetrical, trachea midline and thyroid not enlarged, symmetric, no tenderness/mass/nodules Back: symmetric, no curvature. ROM normal. No CVA tenderness. Lungs: clear to auscultation bilaterally Heart: regular rate and rhythm, S1, S2 normal, no murmur, click, rub or gallop Abdomen: soft, non-tender; bowel sounds normal; no masses,  no organomegaly Extremities:  extremities normal, atraumatic, no cyanosis or edema Pulses: 2+ and symmetric Skin: Skin color, texture, turgor normal. No rashes or lesions Lymph nodes: Cervical, supraclavicular, and axillary nodes normal. Neurologic: Alert and oriented X 3, normal strength and tone. Normal symmetric reflexes. Normal coordination and gait    Assessment:    Healthy female exam.      Plan:      Marland KitchenMarland KitchenAmelie was seen today for annual exam.  Diagnoses and all orders for this visit:  Routine physical examination -     Lipid Panel w/reflex Direct LDL -     TSH -     B12 and Folate Panel -     VITAMIN D 25 Hydroxy (Vit-D Deficiency, Fractures) -     CBC with  Differential/Platelet -     COMPLETE METABOLIC PANEL WITH GFR  Screening for lipid disorders -     Lipid Panel w/reflex Direct LDL  Screening for diabetes mellitus -     COMPLETE METABOLIC PANEL WITH GFR  Vitamin D deficiency -     VITAMIN D 25 Hydroxy (Vit-D Deficiency, Fractures)   .Marland Kitchen Depression screen [facility] 2/9 04/30/2019 02/05/2018  Decreased Interest 0 0  Down, Depressed, Hopeless 0 0  PHQ - 2 Score 0 0  Altered sleeping 0 -  Tired, decreased energy 0 -  Change in appetite 0 -  Feeling bad or failure about yourself  0 -  Trouble concentrating 0 -  Moving slowly or fidgety/restless 0 -  Suicidal thoughts 0 -  PHQ-9 Score 0 -  Difficult doing work/chores Not difficult at all -   .. Discussed 150 minutes of exercise a week.  Encouraged vitamin D 1000 units and Calcium 1300mg  or 4 servings of dairy a day.  Mammogram scheduled.  Pap declined due to upcoming hysterectomy due to uterine prolapse.  Flu shot declined will get at work.    See After Visit Summary for Counseling Recommendations

## 2019-05-01 ENCOUNTER — Encounter: Payer: Self-pay | Admitting: Physician Assistant

## 2019-05-01 DIAGNOSIS — R7301 Impaired fasting glucose: Secondary | ICD-10-CM | POA: Insufficient documentation

## 2019-05-01 DIAGNOSIS — E785 Hyperlipidemia, unspecified: Secondary | ICD-10-CM | POA: Insufficient documentation

## 2019-05-01 NOTE — Progress Notes (Signed)
Regina Mcconnell,   Your bad cholesterol increased quite a bit from 1 year ago. Your HDL(good) cholesterol looks good. Your 10 year cardiovascular risk is still low at 1.3 percent with these numbers. We do strongly consider medication regardless of risk at levels above 190 which you are close to with LDL. I would consider starting a statin to lower this. You most definitely could continue working on diet changes/weight loss as well. What are your thoughts?   Thyroid looks good.   B12 low normal I would start 1021mcg sublingual daily. If your numbers don't improve in 3 months could even consider shots. I like to see numbers above 400 at the least.   Vitamin D is a little low too. I would suggest vitamin D3 OTC 1000 units a day with a meal to help absorption.   Kidney and liver look great.   Fasting sugar just a tad up. We can add a1c to this just to make sure not trending up.

## 2019-05-02 LAB — COMPLETE METABOLIC PANEL WITH GFR
AG Ratio: 1.7 (calc) (ref 1.0–2.5)
ALT: 15 U/L (ref 6–29)
AST: 15 U/L (ref 10–35)
Albumin: 4.5 g/dL (ref 3.6–5.1)
Alkaline phosphatase (APISO): 74 U/L (ref 31–125)
BUN: 21 mg/dL (ref 7–25)
CO2: 24 mmol/L (ref 20–32)
Calcium: 9.7 mg/dL (ref 8.6–10.2)
Chloride: 105 mmol/L (ref 98–110)
Creat: 0.9 mg/dL (ref 0.50–1.10)
GFR, Est African American: 88 mL/min/{1.73_m2} (ref >=60)
GFR, Est Non African American: 76 mL/min/{1.73_m2} (ref >=60)
Globulin: 2.6 g/dL (calc) (ref 1.9–3.7)
Glucose, Bld: 102 mg/dL — ABNORMAL HIGH (ref 65–99)
Potassium: 4.4 mmol/L (ref 3.5–5.3)
Sodium: 139 mmol/L (ref 135–146)
Total Bilirubin: 0.5 mg/dL (ref 0.2–1.2)
Total Protein: 7.1 g/dL (ref 6.1–8.1)

## 2019-05-02 LAB — CBC WITH DIFFERENTIAL/PLATELET
Absolute Monocytes: 415 cells/uL (ref 200–950)
Basophils Absolute: 50 cells/uL (ref 0–200)
Basophils Relative: 0.8 %
Eosinophils Absolute: 99 cells/uL (ref 15–500)
Eosinophils Relative: 1.6 %
HCT: 41.6 % (ref 35.0–45.0)
Hemoglobin: 13.9 g/dL (ref 11.7–15.5)
Lymphs Abs: 2759 cells/uL (ref 850–3900)
MCH: 29.7 pg (ref 27.0–33.0)
MCHC: 33.4 g/dL (ref 32.0–36.0)
MCV: 88.9 fL (ref 80.0–100.0)
MPV: 10.7 fL (ref 7.5–12.5)
Monocytes Relative: 6.7 %
Neutro Abs: 2877 cells/uL (ref 1500–7800)
Neutrophils Relative %: 46.4 %
Platelets: 269 10*3/uL (ref 140–400)
RBC: 4.68 10*6/uL (ref 3.80–5.10)
RDW: 12.5 % (ref 11.0–15.0)
Total Lymphocyte: 44.5 %
WBC: 6.2 10*3/uL (ref 3.8–10.8)

## 2019-05-02 LAB — LIPID PANEL W/REFLEX DIRECT LDL
Cholesterol: 256 mg/dL — ABNORMAL HIGH (ref <200)
HDL: 55 mg/dL (ref >=50)
LDL Cholesterol (Calc): 182 mg/dL (calc) — ABNORMAL HIGH
Non-HDL Cholesterol (Calc): 201 mg/dL (calc) — ABNORMAL HIGH (ref <130)
Total CHOL/HDL Ratio: 4.7 (calc) (ref <5.0)
Triglycerides: 82 mg/dL (ref <150)

## 2019-05-02 LAB — HEMOGLOBIN A1C W/OUT EAG: Hgb A1c MFr Bld: 5.7 % of total Hgb — ABNORMAL HIGH (ref <5.7)

## 2019-05-02 LAB — B12 AND FOLATE PANEL
Folate: 15.4 ng/mL
Vitamin B-12: 334 pg/mL (ref 200–1100)

## 2019-05-02 LAB — TSH: TSH: 3.35 mIU/L

## 2019-05-02 LAB — VITAMIN D 25 HYDROXY (VIT D DEFICIENCY, FRACTURES): Vit D, 25-Hydroxy: 25 ng/mL — ABNORMAL LOW (ref 30–100)

## 2019-05-02 NOTE — Progress Notes (Signed)
A!C is 5.7 just in pre-diabetes range but stable from last check!

## 2019-05-14 ENCOUNTER — Ambulatory Visit
Admission: RE | Admit: 2019-05-14 | Discharge: 2019-05-14 | Disposition: A | Discharge status: Home / Self Care | Payer: No Typology Code available for payment source | Source: Ambulatory Visit | Attending: Physician Assistant | Admitting: Physician Assistant

## 2019-05-14 ENCOUNTER — Other Ambulatory Visit: Payer: Self-pay

## 2019-05-14 DIAGNOSIS — Z1231 Encounter for screening mammogram for malignant neoplasm of breast: Secondary | ICD-10-CM

## 2019-05-16 NOTE — Progress Notes (Signed)
Normal mammogram. Follow up in 1 year.

## 2019-09-22 ENCOUNTER — Encounter: Payer: Self-pay | Admitting: Obstetrics and Gynecology

## 2019-09-22 ENCOUNTER — Telehealth: Payer: Self-pay | Admitting: Obstetrics and Gynecology

## 2019-09-22 NOTE — Telephone Encounter (Signed)
Routing to Rosa Davis.  

## 2019-09-22 NOTE — Telephone Encounter (Signed)
Patient sent the following message through [place]. Routing to triage to assist patient with request.  Regina Mcconnell, Regina Mcconnell Clinical Pool  Phone Number: [phone]  Dr. Quincy Simmonds,   I hope you're doing well. I know that you filled out the provider exception form but it is still being denied. When I called [facility] they are stating that in the notes it does not specify why I was sent to Dr. Zigmund Daniel which is out of network. From what I can gather, they need the why. Why was I sent to Dr. Zigmund Daniel at [facility] if we have providers within [facility] that can do the surgery. I keep telling them it is because I need to have two surgeries performed (Hysterotomy and Anterior Vaginal Prolapse corrected?) and she is the only provider that can do it in one surgery. If Dr. Zigmund Daniel does the surgery it will consist of one surgery vs. two surgeries which is better for me, the patient.  Can you tell me if this was included in the letter that was sent to [facility]? Thanks so much!!

## 2019-09-23 NOTE — Telephone Encounter (Signed)
Call placed to patient in reference to [facility] message. Unable to leave a message phone line has a busy signal.

## 2019-10-27 ENCOUNTER — Telehealth: Payer: Self-pay | Admitting: Physician Assistant

## 2019-10-27 DIAGNOSIS — M549 Dorsalgia, unspecified: Secondary | ICD-10-CM

## 2019-10-27 DIAGNOSIS — M48061 Spinal stenosis, lumbar region without neurogenic claudication: Secondary | ICD-10-CM | POA: Insufficient documentation

## 2019-10-27 DIAGNOSIS — N62 Hypertrophy of breast: Secondary | ICD-10-CM

## 2019-10-27 NOTE — Telephone Encounter (Signed)
Pt has large breast and upper back pain for years. Ready to consider reduction.

## 2019-12-12 ENCOUNTER — Other Ambulatory Visit: Payer: Self-pay

## 2019-12-12 MED ORDER — VALACYCLOVIR HCL 1 G PO TABS: 2000.0000 mg | ORAL_TABLET | Freq: Two times a day (BID) | ORAL | 5 refills | Status: DC

## 2019-12-19 ENCOUNTER — Institutional Professional Consult (permissible substitution): Payer: No Typology Code available for payment source | Admitting: Plastic Surgery

## 2019-12-23 ENCOUNTER — Ambulatory Visit (INDEPENDENT_AMBULATORY_CARE_PROVIDER_SITE_OTHER): Payer: No Typology Code available for payment source | Admitting: Plastic Surgery

## 2019-12-23 ENCOUNTER — Other Ambulatory Visit: Payer: Self-pay

## 2019-12-23 ENCOUNTER — Encounter: Payer: Self-pay | Admitting: Plastic Surgery

## 2019-12-23 VITALS — BP 130/81 | HR 77 | Temp 97.8°F | Ht 64.0 in | Wt 246.6 lb

## 2019-12-23 DIAGNOSIS — N62 Hypertrophy of breast: Secondary | ICD-10-CM

## 2019-12-23 DIAGNOSIS — M5410 Radiculopathy, site unspecified: Secondary | ICD-10-CM | POA: Diagnosis not present

## 2019-12-23 DIAGNOSIS — M545 Low back pain, unspecified: Secondary | ICD-10-CM

## 2019-12-23 DIAGNOSIS — M549 Dorsalgia, unspecified: Secondary | ICD-10-CM | POA: Diagnosis not present

## 2019-12-23 NOTE — Progress Notes (Signed)
Patient ID: Regina Mcconnell, female    DOB: [birth year], 49 y.o.   MRN: [id number]   Chief Complaint  Patient presents with  . Consult    BL breast reduction    Mammary Hyperplasia: The patient is a 49 y.o. female with a history of mammary hyperplasia for several years.  She has extremely large breasts causing symptoms that include the following: Back pain in the upper and lower back, including neck pain. She pulls or pins her bra straps to provide better lift and relief of the pressure and pain. She notices relief by holding her breast up manually.  Her shoulder straps cause grooves and pain and pressure that requires padding for relief. Pain medication is sometimes required with motrin and tylenol.  Activities that are hindered by enlarged breasts include: exercise and running. She often uses creams and antifungal lotion to treat rash and skin break down under her breasts.  Her breasts are extremely large and fairly symmetric with the left larger.  She has hyperpigmentation of the inframammary area on both sides.  The sternal to nipple distance on the right is 39 cm and the left is 42 cm.  The IMF distance is 25 cm.  She is 5 feet 4 inches tall and weighs 246 pounds.  Preoperative bra size = 48 G cup.  She would like to be a B or C cup. The estimated excess breast tissue to be removed at the time of surgery = 900 grams on the left and 900 grams on the right.  Mammogram history: 05/14/19 and negative.  She works at the [employer] in [place].     Review of Systems  Constitutional: Positive for activity change. Negative for appetite change.  HENT: Negative.   Eyes: Negative.   Respiratory: Negative.  Negative for cough and chest tightness.   Cardiovascular: Negative.   Gastrointestinal: Negative.   Endocrine: Negative.   Genitourinary: Negative.   Musculoskeletal: Positive for back pain and neck pain.  Skin: Positive for color change and rash.  Hematological: Negative.     Psychiatric/Behavioral: Negative.     Past Medical History:  Diagnosis Date  . Abnormal Pap smear of cervix 1995   --hx colposcopy w/bx and cryotherapy of cervix(paps normal since)  . Abnormal uterine bleeding    reason for ablation in 2009  . Heart murmur   . Urinary incontinence    especially with exercise    Past Surgical History:  Procedure Laterality Date  . DILATION AND CURETTAGE OF UTERUS     due to heavy cycles  . ENDOMETRIAL ABLATION  2009   Dr. Quincy Simmonds      Current Outpatient Medications:  .  triamcinolone cream (KENALOG) 0.1 %, Apply 1 application topically 2 (two) times daily., Disp: 80 g, Rfl: 0 .  valACYclovir (VALTREX) 1000 MG tablet, Take 2 tablets (2,000 mg total) by mouth 2 (two) times daily. For one day for outbreaks., Disp: 12 tablet, Rfl: 5   Objective:   Vitals:   12/23/19 1407  BP: 130/81  Pulse: 77  Temp: 97.8 F (36.6 C)  SpO2: 100%    Physical Exam Vitals and nursing note reviewed.  Constitutional:      Appearance: Normal appearance.  HENT:     Head: Normocephalic and atraumatic.  Eyes:     Pupils: Pupils are equal, round, and reactive to light.  Cardiovascular:     Rate and Rhythm: Normal rate.     Pulses: Normal pulses.  Pulmonary:  Effort: Pulmonary effort is normal. No respiratory distress.  Abdominal:     General: Abdomen is flat. There is no distension.     Tenderness: There is no abdominal tenderness.  Skin:    General: Skin is warm.     Capillary Refill: Capillary refill takes less than 2 seconds.  Neurological:     General: No focal deficit present.     Mental Status: She is alert and oriented to person, place, and time.  Psychiatric:        Mood and Affect: Mood normal.        Thought Content: Thought content normal.     Assessment & Plan:  Upper back pain  Large breasts  Acute low back pain with disc symptoms, duration less than 6 weeks  Recommend bilateral breast reduction with liposuction. Recommend  physical therapy.  Pictures were obtained of the patient and placed in the chart with the patient's or guardian's permission.    [place], DO

## 2019-12-26 ENCOUNTER — Encounter: Payer: Self-pay | Admitting: Rehabilitative and Restorative Service Providers"

## 2019-12-26 ENCOUNTER — Ambulatory Visit (INDEPENDENT_AMBULATORY_CARE_PROVIDER_SITE_OTHER): Payer: No Typology Code available for payment source | Admitting: Rehabilitative and Restorative Service Providers"

## 2019-12-26 ENCOUNTER — Other Ambulatory Visit: Payer: Self-pay

## 2019-12-26 DIAGNOSIS — M545 Low back pain, unspecified: Secondary | ICD-10-CM

## 2019-12-26 DIAGNOSIS — R293 Abnormal posture: Secondary | ICD-10-CM | POA: Diagnosis not present

## 2019-12-26 DIAGNOSIS — M6281 Muscle weakness (generalized): Secondary | ICD-10-CM

## 2019-12-26 NOTE — Patient Instructions (Signed)
Access Code: [email or web address] URL: [email or web address] Date: 12/26/2019 Prepared by: Rudell Cobb  Exercises Doorway Pec Stretch at 60 Elevation - 2 x daily - 7 x weekly - 1 sets - 2-3 reps - 30 seconds hold Doorway Pec Stretch at 120 Degrees Abduction - 2 x daily - 7 x weekly - 1 sets - 2-3 reps - 30 seconds hold Prone Scapular Retraction Arms at Side - 2 x daily - 7 x weekly - 1 sets - 10 reps Sidelying Thoracic Rotation with Open Book - 2 x daily - 7 x weekly - 1 sets - 10 reps Seated Shoulder External Rotation - 2 x daily - 7 x weekly - 1 sets - 10 reps First Rib Mobilization with Strap - 2 x daily - 7 x weekly - 1 sets - 3 reps - 20 seconds hold

## 2019-12-26 NOTE — Therapy (Signed)
[place], [place], [address] Phone: [phone]   Fax:  [phone]  Physical Therapy Evaluation  Patient Details  Name: Regina Mcconnell MRN: [id number] Date of Birth: 10/20/1970 Referring Provider (PT): Audelia Hives, DO   Encounter Date: 12/26/2019  PT End of Session - 12/26/19 1540    Visit Number  1    Number of Visits  6    Date for PT Re-Evaluation  02/06/20    Authorization Type  UMR    PT Start Time  [id number]    PT Stop Time  1530    PT Time Calculation (min)  43 min       Past Medical History:  Diagnosis Date  . Abnormal Pap smear of cervix 1995   --hx colposcopy w/bx and cryotherapy of cervix(paps normal since)  . Abnormal uterine bleeding    reason for ablation in 2009  . Heart murmur   . Urinary incontinence    especially with exercise    Past Surgical History:  Procedure Laterality Date  . DILATION AND CURETTAGE OF UTERUS     due to heavy cycles  . ENDOMETRIAL ABLATION  2009   Dr. Quincy Simmonds    There were no vitals filed for this visit.   Subjective Assessment - 12/26/19 1449    Subjective  The patient reports that she has had worsening pain over the past year.  She has pain in R upper trapezius, upper back, and mid back.    Patient Stated Goals  Reducing the shoulder and neck pain.    Currently in Pain?  Yes    Pain Score  0-No pain   pain when R upper trapezius flares up (worse with reaching).   Pain Location  Neck    Pain Descriptors / Indicators  Discomfort    Pain Radiating Towards  worse in R shoulder and back    Pain Onset  More than a month ago    Pain Frequency  Intermittent    Aggravating Factors   overhead reaching and lifting    Pain Relieving Factors  sitting at rest         [facility] PT Assessment - 12/26/19 1457      Assessment   Medical Diagnosis  acute low back pain, upper back pain    Referring Provider (PT)  Audelia Hives, DO      Restrictions    Weight Bearing Restrictions  No      Balance Screen   Has the patient fallen in the past 6 months  No    Has the patient had a decrease in activity level because of a fear of falling?   No    Is the patient reluctant to leave their home because of a fear of falling?   No      Home Film/video editor residence      Prior Function   Level of Independence  Independent      Observation/Other Assessments   Focus on Therapeutic Outcomes (FOTO)   2% limitation      Sensation   Light Touch  Impaired Detail   gets some numbness and tingling during sleeping     ROM / Strength   AROM / PROM / Strength  AROM;Strength      AROM   Overall AROM   Within functional limits for tasks performed      Strength   Overall Strength  Deficits  Overall Strength Comments  --    Strength Assessment Site  Hip;Knee;Ankle    Right/Left Hip  Right;Left    Right Hip Flexion  4/5    Left Hip Flexion  4/5    Right/Left Knee  Right;Left    Right Knee Flexion  5/5    Right Knee Extension  5/5    Left Knee Flexion  5/5    Left Knee Extension  5/5    Right/Left Ankle  Right;Left    Right Ankle Dorsiflexion  5/5    Left Ankle Dorsiflexion  5/5      Flexibility   Soft Tissue Assessment /Muscle Length  yes    Hamstrings  mild tightness noted in HS and gastrocs      Palpation   Spinal mobility  tightness in PA mobility of mid thoracic spine    Palpation comment  tenderness R upper trapezius musculature and R levator; tightness in parascapular musculature                Objective measurements completed on examination: See above findings.      [facility] Adult PT Treatment/Exercise - 12/26/19 1534      Self-Care   Self-Care  Other Self-Care Comments    Other Self-Care Comments   sleeping positions      Exercises   Exercises  Shoulder      Shoulder Exercises: Seated   External Rotation  Both;Strengthening;10 reps    External Rotation Limitations  L exercise in  sitting for scapular retraction      Shoulder Exercises: Prone   Horizontal ABduction 2  Strengthening;Both;10 reps      Shoulder Exercises: Sidelying   ABduction  AROM;Both;10 reps    ABduction Limitations  shoulder horizontal abduction with thoracic opening exercises      Shoulder Exercises: Stretch   Other Shoulder Stretches  Door frame at 60 and 120 degrees of abduction x 20 second holds    Other Shoulder Stretches  first rib mobilization sitting with belt for upper trap stretching             PT Education - 12/26/19 1525    Education Details  HEP    Person(s) Educated  Patient    Methods  Explanation;Demonstration;Handout    Comprehension  Returned demonstration;Verbalized understanding          PT Long Term Goals - 12/26/19 1540      PT LONG TERM GOAL #1   Title  The patient will be independent with HEP for upper/mid/low back stabilization and postural stretching.    Time  6    Period  Weeks    Target Date  02/06/20      PT LONG TERM GOAL #2   Title  The patient will report being able to perform recreational activities/ household chores without c/o low and mid back pain.    Time  6    Period  Weeks    Target Date  02/06/20      PT LONG TERM GOAL #3   Title  The patient will verbalize understanding of sleeping positions to reduce R upper trapezius shortening.    Time  6    Period  Weeks    Target Date  02/06/20      PT LONG TERM GOAL #4   Title  The patient will improve bilatral hip strength to 5/5.    Time  6    Period  Weeks    Target Date  02/06/20  Plan - 12/26/19 1528    Clinical Impression Statement  The patient is a 49 yo female presenting to OP physical therapy with chronic back pain due to large breasts.  She is in consultation with physician for breast reduction.  The patient has impairments of weakness in bilateral hip flexor musculature, mild tightness in gastrocs and HS musculature, abnormal posture with anterior chest  shortening, trigger point R upper trapezius leading to decreased performance of heavy household chores (weed eating, lifting heavy items).  She also has intermittent flare ups of R upper trapezius trigger point worsened by sleeping positions.  PT to address deficits to strengthen postural muscles, and reduce discomfort in back and neck.    Examination-Activity Limitations  Lift    Stability/Clinical Decision Making  Stable/Uncomplicated    Clinical Decision Making  Low    Rehab Potential  Good    PT Frequency  1x / week    PT Duration  6 weeks    PT Treatment/Interventions  Dry needling;ADLs/Self Care Home Management;Therapeutic exercise;Therapeutic activities;Manual techniques;Taping;Passive range of motion;Patient/family education;Electrical Stimulation;Moist Heat    PT Next Visit Plan  Add gastroc stretching, add low back stabilization, consider dry needling for R upper trapezius musculature, parascapular mobility, postural strengthening    PT Home Exercise Plan  Access Code: [email or web address]    Consulted and Agree with Plan of Care  Patient       Patient will benefit from skilled therapeutic intervention in order to improve the following deficits and impairments:  Pain, Hypomobility, Impaired flexibility, Postural dysfunction, Decreased strength, Increased fascial restricitons  Visit Diagnosis: Acute midline low back pain without sciatica  Abnormal posture  Muscle weakness (generalized)     Problem List Patient Active Problem List   Diagnosis Date Noted  . Back pain 10/27/2019  . Hyperlipidemia LDL goal <130 05/01/2019  . Elevated fasting glucose 05/01/2019  . Vitamin D insufficiency 04/30/2019  . Incomplete uterovaginal prolapse 04/01/2019  . Stress incontinence 04/01/2019  . Morbid obesity ([place]) 01/01/2019  . Symptomatic mammary hypertrophy 01/01/2019  . B12 nutritional deficiency 02/06/2018  . Class 3 severe obesity due to excess calories without serious comorbidity with body  mass index (BMI) of 40.0 to 44.9 in adult ([place]) 02/05/2018  . Pulmonary nodule 06/19/2017  . Elevated liver enzymes 05/23/2017  . Pain of upper abdomen 05/23/2017  . Spleen enlarged 05/23/2017  . Poison ivy dermatitis 01/02/2013  . Family history of early CAD 07/23/2012  . Family history of diabetes mellitus 07/23/2012  . Heart murmur 07/23/2012  . Acute low back pain with disc symptoms, duration less than 6 weeks 06/03/2012    Starlett Pehrson, PT 12/26/2019, 3:50 PM  [facility] [place], [place], [address] Phone: [phone]   Fax:  [phone]  Name: Regina Mcconnell MRN: [id number] Date of Birth: 1970-10-31

## 2020-01-02 ENCOUNTER — Encounter: Payer: Self-pay | Admitting: Rehabilitative and Restorative Service Providers"

## 2020-01-02 ENCOUNTER — Ambulatory Visit (INDEPENDENT_AMBULATORY_CARE_PROVIDER_SITE_OTHER): Payer: No Typology Code available for payment source | Admitting: Rehabilitative and Restorative Service Providers"

## 2020-01-02 ENCOUNTER — Other Ambulatory Visit: Payer: Self-pay

## 2020-01-02 DIAGNOSIS — R293 Abnormal posture: Secondary | ICD-10-CM | POA: Diagnosis not present

## 2020-01-02 DIAGNOSIS — M545 Low back pain, unspecified: Secondary | ICD-10-CM

## 2020-01-02 DIAGNOSIS — M6281 Muscle weakness (generalized): Secondary | ICD-10-CM

## 2020-01-02 NOTE — Therapy (Signed)
[place], [place], [address] Phone: [phone]   Fax:  [phone]  Physical Therapy Treatment  Patient Details  Name: Regina Mcconnell MRN: [id number] Date of Birth: [birth year] Referring Provider (PT): Audelia Hives, DO   Encounter Date: 01/02/2020  PT End of Session - 01/02/20 1105    Visit Number  2    Number of Visits  6    Date for PT Re-Evaluation  02/06/20    Authorization Type  UMR    PT Start Time  1103    PT Stop Time  1149   moist heat end of treatment   PT Time Calculation (min)  46 min    Activity Tolerance  Patient tolerated treatment well       Past Medical History:  Diagnosis Date  . Abnormal Pap smear of cervix 1995   --hx colposcopy w/bx and cryotherapy of cervix(paps normal since)  . Abnormal uterine bleeding    reason for ablation in 2009  . Heart murmur   . Urinary incontinence    especially with exercise    Past Surgical History:  Procedure Laterality Date  . DILATION AND CURETTAGE OF UTERUS     due to heavy cycles  . ENDOMETRIAL ABLATION  2009   Dr. Quincy Simmonds    There were no vitals filed for this visit.  Subjective Assessment - 01/02/20 1105    Subjective  patient reports continued tightness through Rt upper trap and anterior pec area    Currently in Pain?  No/denies                       [name] - 01/02/20 0001      Therapeutic Activites    Therapeutic Activities  --   myofacial ball release work standing      Shoulder Exercises: Seated   External Rotation  Both;Strengthening;10 reps    External Rotation Limitations  L exercise in sitting for scapular retraction    Other Seated Exercises  lateral cervical flexion seated 20 sec x 3 reps       Shoulder Exercises: Stretch   Other Shoulder Stretches  doorway stretch 3 positions 30 sec hold x 2       Moist Heat Therapy   Number Minutes Moist Heat  10 Minutes    Moist Heat Location  Cervical   upper trap sitting      Manual Therapy   Manual therapy comments  pt supine and sitting     Joint Mobilization  cervical PA mobs supine grade II/III    Soft tissue mobilization  deep tissue work through the Rt upper trap/leveatorand scapular area     Myofascial Release  upper trap     Passive ROM  cervical flexion; cervical flexion with slight rotation 10-20 sec hold x 3 reps     Manual Traction  cervical traction              PT Education - 01/02/20 1142    Education Details  HEP    Person(s) Educated  Patient    Methods  Explanation;Demonstration;Tactile cues;Verbal cues;Handout    Comprehension  Verbalized understanding;Returned demonstration;Verbal cues required;Tactile cues required          PT Long Term Goals - 12/26/19 1540      PT LONG TERM GOAL #1   Title  The patient will be independent with HEP for upper/mid/low back stabilization and postural stretching.  Time  6    Period  Weeks    Target Date  02/06/20      PT LONG TERM GOAL #2   Title  The patient will report being able to perform recreational activities/ household chores without c/o low and mid back pain.    Time  6    Period  Weeks    Target Date  02/06/20      PT LONG TERM GOAL #3   Title  The patient will verbalize understanding of sleeping positions to reduce R upper trapezius shortening.    Time  6    Period  Weeks    Target Date  02/06/20      PT LONG TERM GOAL #4   Title  The patient will improve bilatral hip strength to 5/5.    Time  6    Period  Weeks    Target Date  02/06/20            Plan - 01/02/20 1132    Clinical Impression Statement  Patient reports that she has been working on her exercises at home. Added stretching and manual work. Patient instructed in myofacial ball release work and worked on that in the clinic. Discussed trial of DN,    Rehab Potential  Good    PT Frequency  1x / week    PT Duration  6 weeks    PT  Treatment/Interventions  Dry needling;ADLs/Self Care Home Management;Therapeutic exercise;Therapeutic activities;Manual techniques;Taping;Passive range of motion;Patient/family education;Electrical Stimulation;Moist Heat    PT Next Visit Plan  Add gastroc stretching, add low back stabilization, consider dry needling for R upper trapezius musculature, parascapular mobility, postural strengthening    PT Home Exercise Plan  Access Code: [email or web address]    Consulted and Agree with Plan of Care  Patient       Patient will benefit from skilled therapeutic intervention in order to improve the following deficits and impairments:     Visit Diagnosis: Acute midline low back pain without sciatica  Abnormal posture  Muscle weakness (generalized)     Problem List Patient Active Problem List   Diagnosis Date Noted  . Back pain 10/27/2019  . Hyperlipidemia LDL goal <130 05/01/2019  . Elevated fasting glucose 05/01/2019  . Vitamin D insufficiency 04/30/2019  . Incomplete uterovaginal prolapse 04/01/2019  . Stress incontinence 04/01/2019  . Morbid obesity ([place]) 01/01/2019  . Symptomatic mammary hypertrophy 01/01/2019  . B12 nutritional deficiency 02/06/2018  . Class 3 severe obesity due to excess calories without serious comorbidity with body mass index (BMI) of 40.0 to 44.9 in adult ([place]) 02/05/2018  . Pulmonary nodule 06/19/2017  . Elevated liver enzymes 05/23/2017  . Pain of upper abdomen 05/23/2017  . Spleen enlarged 05/23/2017  . Poison ivy dermatitis 01/02/2013  . Family history of early CAD 07/23/2012  . Family history of diabetes mellitus 07/23/2012  . Heart murmur 07/23/2012  . Acute low back pain with disc symptoms, duration less than 6 weeks 06/03/2012    Jozelynn Danielson Nilda Simmer PT, MPH  01/02/2020, 11:48 AM  [facility] [place], [place], [address] Phone: [phone]   Fax:  [phone]  Name: Regina Mcconnell MRN:  [id number] Date of Birth: [birth year]

## 2020-01-02 NOTE — Patient Instructions (Signed)
Access Code: [email or web address]: [email or web address]: 05/07/2021Prepared by: Rakia Frayne HoltExercises  Doorway Pec Stretch at 60 Elevation - 2 x daily - 7 x weekly - 1 sets - 2-3 reps - 30 seconds hold  Doorway Pec Stretch at 120 Degrees Abduction - 2 x daily - 7 x weekly - 1 sets - 2-3 reps - 30 seconds hold  Prone Scapular Retraction Arms at Side - 2 x daily - 7 x weekly - 1 sets - 10 reps  Sidelying Thoracic Rotation with Open Book - 2 x daily - 7 x weekly - 1 sets - 10 reps  Seated Shoulder External Rotation - 2 x daily - 7 x weekly - 1 sets - 10 reps  First Rib Mobilization with Strap - 2 x daily - 7 x weekly - 1 sets - 3 reps - 20 seconds hold  Supine Cervical Retraction with Towel - 2 x daily - 7 x weekly - 1 sets - 3 reps - 10 sec hold  Seated Cervical Sidebending Stretch - 2 x daily - 7 x weekly - 1 sets - 3 reps - 30 sec hold

## 2020-01-09 ENCOUNTER — Encounter: Payer: No Typology Code available for payment source | Admitting: Rehabilitative and Restorative Service Providers"

## 2020-01-13 ENCOUNTER — Ambulatory Visit (INDEPENDENT_AMBULATORY_CARE_PROVIDER_SITE_OTHER): Payer: No Typology Code available for payment source | Admitting: Rehabilitative and Restorative Service Providers"

## 2020-01-13 ENCOUNTER — Other Ambulatory Visit: Payer: Self-pay

## 2020-01-13 ENCOUNTER — Encounter: Payer: Self-pay | Admitting: Rehabilitative and Restorative Service Providers"

## 2020-01-13 DIAGNOSIS — M545 Low back pain, unspecified: Secondary | ICD-10-CM

## 2020-01-13 DIAGNOSIS — M6281 Muscle weakness (generalized): Secondary | ICD-10-CM

## 2020-01-13 DIAGNOSIS — R293 Abnormal posture: Secondary | ICD-10-CM | POA: Diagnosis not present

## 2020-01-13 NOTE — Therapy (Signed)
[place], [place], [address] Phone: [phone]   Fax:  [phone]  Physical Therapy Treatment  Patient Details  Name: Regina Mcconnell MRN: [id number] Date of Birth: 11-21-1970 Referring Provider (PT): Audelia Hives, DO   Encounter Date: 01/13/2020  PT End of Session - 01/13/20 0937    Visit Number  3    Number of Visits  6    Date for PT Re-Evaluation  02/06/20    Authorization Type  UMR    PT Start Time  0933    PT Stop Time  1005    PT Time Calculation (min)  32 min    Activity Tolerance  Patient tolerated treatment well       Past Medical History:  Diagnosis Date  . Abnormal Pap smear of cervix 1995   --hx colposcopy w/bx and cryotherapy of cervix(paps normal since)  . Abnormal uterine bleeding    reason for ablation in 2009  . Heart murmur   . Urinary incontinence    especially with exercise    Past Surgical History:  Procedure Laterality Date  . DILATION AND CURETTAGE OF UTERUS     due to heavy cycles  . ENDOMETRIAL ABLATION  2009   Dr. Quincy Simmonds    There were no vitals filed for this visit.  Subjective Assessment - 01/13/20 0937    Subjective  Shoulder is feeling looser. Working on her exercises. Did not work outside or overuse her arms this weekend.    Currently in Pain?  No/denies         [facility] PT Assessment - 01/13/20 0001      Assessment   Medical Diagnosis  acute low back pain, upper back pain    Referring Provider (PT)  Audelia Hives, DO      Palpation   Spinal mobility  tightness in PA mobility of mid thoracic spine    Palpation comment  tenderness R upper trapezius musculature and R levator; tightness in parascapular musculature; pecs                     [facility] Adult PT Treatment/Exercise - 01/13/20 0001      Lumbar Exercises: Aerobic   UBE (Upper Arm Bike)  L2 x 2 min - 1 min fwd/1 back       Shoulder Exercises: Standing   Extension   Strengthening;Both;20 reps;Theraband    Theraband Level (Shoulder Extension)  Level 3 (Green)    Row  Strengthening;Both;20 reps;Theraband    Theraband Level (Shoulder Row)  Level 3 (Green)      Shoulder Exercises: Therapy Ball   Flexion  Both   stretch inbto flexion 30 sec hold x 2 reps    Other Therapy Ball Exercises  rolling ball on wall overhead ~ 1 min x 2 reps     Other Therapy Ball Exercises  bouncing large orange ball on wall overhead ~ 1 min x 2 reps       Shoulder Exercises: Stretch   Other Shoulder Stretches  doorway stretch 3 positions 30 sec hold x 2       Shoulder Exercises: Body Blade   Flexion  60 seconds;2 reps   each hand      Manual Therapy   Manual therapy comments  pt sitting     Soft tissue mobilization  deep tissue work through the Rt > Lt upper trap/leveator and scapular area     Passive ROM  lateral cervical flexion  with overpressure 10 sec x 3 reps each side              PT Education - 01/13/20 0951    Education Details  HEP    Person(s) Educated  Patient    Methods  Explanation;Demonstration;Tactile cues;Verbal cues;Handout    Comprehension  Verbalized understanding;Returned demonstration;Verbal cues required;Tactile cues required          PT Long Term Goals - 12/26/19 1540      PT LONG TERM GOAL #1   Title  The patient will be independent with HEP for upper/mid/low back stabilization and postural stretching.    Time  6    Period  Weeks    Target Date  02/06/20      PT LONG TERM GOAL #2   Title  The patient will report being able to perform recreational activities/ household chores without c/o low and mid back pain.    Time  6    Period  Weeks    Target Date  02/06/20      PT LONG TERM GOAL #3   Title  The patient will verbalize understanding of sleeping positions to reduce R upper trapezius shortening.    Time  6    Period  Weeks    Target Date  02/06/20      PT LONG TERM GOAL #4   Title  The patient will improve bilatral hip  strength to 5/5.    Time  6    Period  Weeks    Target Date  02/06/20            Plan - 01/13/20 1012    Clinical Impression Statement  Patient reports that she is feeling better. No pain today. She has been working on her exercises at home. Added posterior shoulder girdle strengthening without difficulty. Worked on deep tissue release through the upper traps with pt in sitting. Progressing well toward stated goals of therapy.    Rehab Potential  Good    PT Frequency  1x / week    PT Duration  6 weeks    PT Treatment/Interventions  Dry needling;ADLs/Self Care Home Management;Therapeutic exercise;Therapeutic activities;Manual techniques;Taping;Passive range of motion;Patient/family education;Electrical Stimulation;Moist Heat    PT Next Visit Plan  Add gastroc stretching, add low back stabilization, consider dry needling for R upper trapezius musculature, parascapular mobility, postural strengthening    PT Home Exercise Plan  Access Code: [email or web address]; [email or web address]    Consulted and Agree with Plan of Care  Patient       Patient will benefit from skilled therapeutic intervention in order to improve the following deficits and impairments:     Visit Diagnosis: Acute midline low back pain without sciatica  Abnormal posture  Muscle weakness (generalized)     Problem List Patient Active Problem List   Diagnosis Date Noted  . Back pain 10/27/2019  . Hyperlipidemia LDL goal <130 05/01/2019  . Elevated fasting glucose 05/01/2019  . Vitamin D insufficiency 04/30/2019  . Incomplete uterovaginal prolapse 04/01/2019  . Stress incontinence 04/01/2019  . Morbid obesity ([place]) 01/01/2019  . Symptomatic mammary hypertrophy 01/01/2019  . B12 nutritional deficiency 02/06/2018  . Class 3 severe obesity due to excess calories without serious comorbidity with body mass index (BMI) of 40.0 to 44.9 in adult ([place]) 02/05/2018  . Pulmonary nodule 06/19/2017  . Elevated liver enzymes 05/23/2017  . Pain of  upper abdomen 05/23/2017  . Spleen enlarged 05/23/2017  . Poison ivy dermatitis 01/02/2013  . Family history of  early CAD 07/23/2012  . Family history of diabetes mellitus 07/23/2012  . Heart murmur 07/23/2012  . Acute low back pain with disc symptoms, duration less than 6 weeks 06/03/2012    Meghen Akopyan Nilda Simmer PT, MPH  01/13/2020, 10:15 AM  [facility] [place], [place], [address] Phone: [phone]   Fax:  [phone]  Name: Regina Mcconnell MRN: [id number] Date of Birth: [birth year]

## 2020-01-13 NOTE — Patient Instructions (Signed)
Resisted External Rotation: in Neutral - Bilateral   PALMS UP Sit or stand, tubing in both hands, elbows at sides, bent to 90, forearms forward. Pinch shoulder blades together and rotate forearms out. Keep elbows at sides. Repeat __10__ times per set. Do _2-3___ sets per session. Do _2-3___ sessions per day.   Low Row: Standing   Face anchor, feet shoulder width apart. Palms up, pull arms back, squeezing shoulder blades together. Repeat 10__ times per set. Do 2-3__ sets per session. Do 1__ sessions per week. Anchor Height: Waist   Bow and arrow standing as above - pull one arm back to "shoot the bow" 10 reps  X 2 sets  Repeat with opposite arm   Strengthening: Resisted Extension   Hold tubing in right hand, arm forward. Pull arm back, elbow straight. Repeat _10___ times per set. Do 2-3____ sets per session. Do 1___ sessions per day.

## 2020-01-21 ENCOUNTER — Encounter: Payer: No Typology Code available for payment source | Admitting: Physical Therapy

## 2020-01-28 ENCOUNTER — Ambulatory Visit (INDEPENDENT_AMBULATORY_CARE_PROVIDER_SITE_OTHER): Payer: No Typology Code available for payment source | Admitting: Rehabilitative and Restorative Service Providers"

## 2020-01-28 ENCOUNTER — Other Ambulatory Visit: Payer: Self-pay

## 2020-01-28 ENCOUNTER — Encounter: Payer: Self-pay | Admitting: Rehabilitative and Restorative Service Providers"

## 2020-01-28 DIAGNOSIS — M6281 Muscle weakness (generalized): Secondary | ICD-10-CM | POA: Diagnosis not present

## 2020-01-28 DIAGNOSIS — M545 Low back pain, unspecified: Secondary | ICD-10-CM

## 2020-01-28 DIAGNOSIS — R293 Abnormal posture: Secondary | ICD-10-CM

## 2020-01-28 NOTE — Therapy (Addendum)
Regina Mcconnell, [place], [address] Phone: [phone]   Fax:  [phone]  Physical Therapy Treatment  Patient Details  Name: Regina Mcconnell MRN: [id number] Date of Birth: May 31, 1971 Referring Provider (PT): Audelia Hives, DO   Encounter Date: 01/28/2020  PT End of Session - 01/28/20 0805    Visit Number  4    Number of Visits  6    Date for PT Re-Evaluation  02/06/20    Authorization Type  [facility]    PT Start Time  0803    PT Stop Time  0842    PT Time Calculation (min)  39 min    Activity Tolerance  Patient tolerated treatment well       Past Medical History:  Diagnosis Date  . Abnormal Pap smear of cervix 1995   --hx colposcopy w/bx and cryotherapy of cervix(paps normal since)  . Abnormal uterine bleeding    reason for ablation in 2009  . Heart murmur   . Urinary incontinence    especially with exercise    Past Surgical History:  Procedure Laterality Date  . DILATION AND CURETTAGE OF UTERUS     due to heavy cycles  . ENDOMETRIAL ABLATION  2009   Dr. Quincy Simmonds    There were no vitals filed for this visit.  Subjective Assessment - 01/28/20 0805    Subjective  Has a surgery date for breast reduction 03/03/20 and is excited about that! Some discomfort in the Rt upper trap today from yard work Sunday. Improving each day since. Working on exercises at home.    Currently in Pain?  No/denies         [facility] PT Assessment - 01/28/20 0001      Assessment   Medical Diagnosis  acute low back pain, upper back pain    Referring Provider (PT)  Audelia Hives, DO      AROM   Cervical Flexion  35    Cervical Extension  56    Cervical - Right Side Bend  39    Cervical - Left Side Bend  46    Cervical - Right Rotation  78    Cervical - Left Rotation  82      Palpation   Spinal mobility  tightness in PA mobility of mid thoracic spine    Palpation comment  tenderness R pecs; upper trapezius  musculature and R levator; tightness in parascapular musculature; pecs                     [facility] Adult PT Treatment/Exercise - 01/28/20 0001      Shoulder Exercises: Seated   Other Seated Exercises  lateral cervical flexion seated 20 sec x 3 reps       Shoulder Exercises: Standing   Extension  Strengthening;Both;20 reps;Theraband    Theraband Level (Shoulder Extension)  Level 4 (Blue)    Row  Strengthening;Both;20 reps;Theraband    Theraband Level (Shoulder Row)  Level 4 (Blue)    Row Limitations  bow and arrow step back w/ blue TB x 15 each side     Retraction Limitations  reverse wall push up 5 sec hold x 15 reps     Diagonals  Strengthening;Right;Left;20 reps;Theraband   shoulder adduction    Theraband Level (Shoulder Diagonals)  Level 4 (Blue)    Other Standing Exercises  lat pull down blue TB over door 15 reps x 2 sets     Other Standing Exercises  scapular depression blue TB over door - 3 sec hold x 20 reps       Shoulder Exercises: Stretch   Other Shoulder Stretches  doorway stretch 3 positions 30 sec hold x 2       Shoulder Exercises: Body Blade   Flexion  60 seconds;3 reps   bilat UE overhead      Manual Therapy   Manual therapy comments  pt sitting     Soft tissue mobilization  deep tissue work through the Rt > Lt upper trap/leveator and scapular area              PT Education - 01/28/20 0835    Education Details  HEP    Person(s) Educated  Patient    Methods  Explanation;Demonstration;Tactile cues;Verbal cues;Handout    Comprehension  Verbalized understanding;Returned demonstration;Verbal cues required;Tactile cues required          PT Long Term Goals - 12/26/19 1540      PT LONG TERM GOAL #1   Title  The patient will be independent with HEP for upper/mid/low back stabilization and postural stretching.    Time  6    Period  Weeks    Target Date  02/06/20      PT LONG TERM GOAL #2   Title  The patient will report being able to perform  recreational activities/ household chores without c/o low and mid back pain.    Time  6    Period  Weeks    Target Date  02/06/20      PT LONG TERM GOAL #3   Title  The patient will verbalize understanding of sleeping positions to reduce R upper trapezius shortening.    Time  6    Period  Weeks    Target Date  02/06/20      PT LONG TERM GOAL #4   Title  The patient will improve bilatral hip strength to 5/5.    Time  6    Period  Weeks    Target Date  02/06/20            Plan - 01/28/20 0816    Clinical Impression Statement  Decreased pain and improved activity and exercise tolerance. Good cervical mobility. Minimal to moderate tightness palpable through the Rt upper trap/leveator. progressing well with strengthening.    Rehab Potential  Good    PT Frequency  1x / week    PT Duration  6 weeks    PT Treatment/Interventions  Dry needling;ADLs/Self Care Home Management;Therapeutic exercise;Therapeutic activities;Manual techniques;Taping;Passive range of motion;Patient/family education;Electrical Stimulation;Moist Heat    PT Next Visit Plan  Continue with parascapular mobility, posterior shoulder girdle/postural strengthening - - patient will call for appointment    PT Home Exercise Plan  Access Code: [email or web address]; [email or web address]    Consulted and Agree with Plan of Care  Patient       Patient will benefit from skilled therapeutic intervention in order to improve the following deficits and impairments:     Visit Diagnosis: Acute midline low back pain without sciatica  Abnormal posture  Muscle weakness (generalized)     Problem List Patient Active Problem List   Diagnosis Date Noted  . Back pain 10/27/2019  . Hyperlipidemia LDL goal <130 05/01/2019  . Elevated fasting glucose 05/01/2019  . Vitamin D insufficiency 04/30/2019  . Incomplete uterovaginal prolapse 04/01/2019  . Stress incontinence 04/01/2019  . Morbid obesity ([place]) 01/01/2019  . Symptomatic mammary hypertrophy  01/01/2019  . B12 nutritional deficiency  02/06/2018  . Class 3 severe obesity due to excess calories without serious comorbidity with body mass index (BMI) of 40.0 to 44.9 in adult ([place]) 02/05/2018  . Pulmonary nodule 06/19/2017  . Elevated liver enzymes 05/23/2017  . Pain of upper abdomen 05/23/2017  . Spleen enlarged 05/23/2017  . Poison ivy dermatitis 01/02/2013  . Family history of early CAD 07/23/2012  . Family history of diabetes mellitus 07/23/2012  . Heart murmur 07/23/2012  . Acute low back pain with disc symptoms, duration less than 6 weeks 06/03/2012    [place] Nilda Simmer PT, MPH  01/28/2020, 8:49 AM  [facility] [address] [place], [place], [address] Phone: [phone]   Fax:  [phone]  Name: Regina Mcconnell MRN: [id number] Date of Birth: 01/30/1971  PHYSICAL THERAPY DISCHARGE SUMMARY  Visits from Start of Care: 4  Current functional level related to goals / functional outcomes: See last progress note for discharge status   Remaining deficits: Unknown    Education / Equipment: HEP  Plan: Patient agrees to discharge.  Patient goals were partially met. Patient is being discharged due to being pleased with the current functional level.  ?????     Fadia Marlar P. Helene Kelp PT, MPH 03/10/20 12:00 PM

## 2020-01-28 NOTE — Patient Instructions (Signed)
Access Code: [email or web address]: [email or web address]: 05/07/2021Prepared by: Sedonia Kitner HoltExercises  Doorway Pec Stretch at 60 Elevation - 2 x daily - 7 x weekly - 1 sets - 2-3 reps - 30 seconds hold  Doorway Pec Stretch at 120 Degrees Abduction - 2 x daily - 7 x weekly - 1 sets - 2-3 reps - 30 seconds hold  Prone Scapular Retraction Arms at Side - 2 x daily - 7 x weekly - 1 sets - 10 reps  Sidelying Thoracic Rotation with Open Book - 2 x daily - 7 x weekly - 1 sets - 10 reps  Seated Shoulder External Rotation - 2 x daily - 7 x weekly - 1 sets - 10 reps  First Rib Mobilization with Strap - 2 x daily - 7 x weekly - 1 sets - 3 reps - 20 seconds hold  Supine Cervical Retraction with Towel - 2 x daily - 7 x weekly - 1 sets - 3 reps - 10 sec hold  Seated Cervical Sidebending Stretch - 2 x daily - 7 x weekly - 1 sets - 3 reps - 30 sec hold  Standing Lat Pull Down with Resistance - Elbows Bent - 1 x daily - 7 x weekly - 2-3 sets - 10 reps - 3 sec hold  Shoulder Adduction with Anchored Resistance - 1 x daily - 7 x weekly - 2-3 sets - 10 reps - 3 sec hold  Bilateral Scapular Depression with Anchored Resistance - Straight Arm - 1 x daily - 7 x weekly - 2-3 sets - 10 reps - 3 sec hold  Wall Push Up - 1 x daily - 7 x weekly - 2-3 sets - 10 reps - 5 sec hold

## 2020-02-11 NOTE — H&P (View-Only) (Signed)
ICD-10-CM   1. Symptomatic mammary hypertrophy  N62       Patient ID: Regina Mcconnell, female    DOB: 10-Dec-1970, 49 y.o.   MRN: [id number]   History of Present Illness: Regina Mcconnell is a 49 y.o.  female  with a history of mammary hypoplasia.  She presents for preoperative evaluation for upcoming procedure, bilateral breast reduction with liposuction, scheduled for 03/03/2020 with Dr. Marla Roe.  Summary from previous visit: Patient's breasts are large and fairly symmetric with the left larger than the right.  Sternal to nipple distance on the right is 39 cm and the left is 42 cm.  The IMF distance is 25 cm.  She is 5 feet 4 inches tall and weighs 246 pounds.  Preoperative bra size is 48 G.  She would like to be a B or C cup.  The estimated excess breast tissue to be removed at the time of surgery is 900 g on each side.  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   The patient has had problems with anesthesia. Nausea - would like the nausea patch for surgery  Past Medical History: Allergies: No Known Allergies  Current Medications:  Current Outpatient Medications:  .  triamcinolone cream (KENALOG) 0.1 %, Apply 1 application topically 2 (two) times daily., Disp: 80 g, Rfl: 0 .  valACYclovir (VALTREX) 1000 MG tablet, Take 2 tablets (2,000 mg total) by mouth 2 (two) times daily. For one day for outbreaks., Disp: 12 tablet, Rfl: 5  Past Medical Problems: Past Medical History:  Diagnosis Date  . Abnormal Pap smear of cervix 1995   --hx colposcopy w/bx and cryotherapy of cervix(paps normal since)  . Abnormal uterine bleeding    reason for ablation in 2009  . Heart murmur   . Urinary incontinence    especially with exercise    Past Surgical History: Past Surgical History:  Procedure Laterality Date  . DILATION AND CURETTAGE OF UTERUS     due to  heavy cycles  . ENDOMETRIAL ABLATION  2009   Dr. Quincy Simmonds    Social History: Social History   Socioeconomic History  . Marital status: Married    Spouse name: Francee Piccolo  . Number of children: 1  . Years of education: Assoc degr  . Highest education level: Not on file  Occupational History  . Occupation: Counsellor: [employer]  Tobacco Use  . Smoking status: Never Smoker  . Smokeless tobacco: Never Used  Substance and Sexual Activity  . Alcohol use: Yes    Alcohol/week: 0.0 standard drinks    Comment: once or twice a year  . Drug use: No  . Sexual activity: Yes    Partners: Male    Birth control/protection: None  Other Topics Concern  . Not on file  Social History Narrative   Working out 1 hour a day.  1-2 caffeine drinks per day.     Social Determinants of Health   Financial Resource Strain:   . Difficulty of Paying Living Expenses:   Food Insecurity:   . Worried About Charity fundraiser in the Last Year:   . Arboriculturist in the Last Year:   Transportation Needs:   . Film/video editor (Medical):   Marland Kitchen Lack of Transportation (Non-Medical):   Physical Activity:   .  Days of Exercise per Week:   . Minutes of Exercise per Session:   Stress:   . Feeling of Stress :   Social Connections:   . Frequency of Communication with Friends and Family:   . Frequency of Social Gatherings with Friends and Family:   . Attends Religious Services:   . Active Member of Clubs or Organizations:   . Attends Archivist Meetings:   Marland Kitchen Marital Status:   Intimate Partner Violence:   . Fear of Current or Ex-Partner:   . Emotionally Abused:   Marland Kitchen Physically Abused:   . Sexually Abused:     Family History: Family History  Problem Relation Age of Onset  . Diabetes Mother   . Thyroid disease Mother   . Heart disease Father   . Heart disease Paternal Uncle        all deceased from heart attacks  . Stroke Paternal Grandmother   . Breast cancer Neg Hx      Review of Systems: Review of Systems  Constitutional: Negative for chills and fever.  HENT: Negative for congestion and sore throat.   Respiratory: Negative for cough and shortness of breath.   Cardiovascular: Negative for chest pain and palpitations.  Gastrointestinal: Negative for abdominal pain, nausea and vomiting.  Musculoskeletal: Positive for back pain and neck pain. Negative for joint pain and myalgias.  Skin: Negative for itching and rash.    Physical Exam: Vital Signs BP 135/73 (BP Location: Left Arm, Patient Position: Sitting, Cuff Size: Large)   Pulse 84   Temp 98.4 F (36.9 C) (Temporal)   Ht 5\' 4"  (1.626 m)   Wt 238 lb (108 kg)   SpO2 100%   BMI 40.85 kg/m  Physical Exam Constitutional:      Appearance: Normal appearance. She is normal weight.  HENT:     Head: Normocephalic and atraumatic.  Eyes:     Extraocular Movements: Extraocular movements intact.  Cardiovascular:     Rate and Rhythm: Normal rate and regular rhythm.     Pulses: Normal pulses.     Heart sounds: Normal heart sounds.  Pulmonary:     Effort: Pulmonary effort is normal.     Breath sounds: Normal breath sounds. No wheezing, rhonchi or rales.  Abdominal:     General: Bowel sounds are normal.     Palpations: Abdomen is soft.  Musculoskeletal:        General: No swelling. Normal range of motion.     Cervical back: Normal range of motion.  Skin:    General: Skin is warm and dry.     Coloration: Skin is not pale.     Findings: No erythema or rash.  Neurological:     General: No focal deficit present.     Mental Status: She is alert and oriented to person, place, and time.  Psychiatric:        Mood and Affect: Mood normal.        Behavior: Behavior normal.        Thought Content: Thought content normal.        Judgment: Judgment normal.     Assessment/Plan:  Ms. Genson scheduled for bilateral breast reduction with liposuction with Dr. Marla Roe.  Risks, benefits, and alternatives of  procedure discussed, questions answered and consent obtained.    Smoking Status: non-smoker Last Mammogram: 05/14/2019; Results: No findings suspicious for malignancy  Caprini Score: 5 High; Risk Factors include: 49 year old female, BMI > 25, varicose veins, and length of planned  surgery. Recommendation for mechanical and pharmacological prophylaxis during surgery. Encourage early ambulation.   Pictures obtained: 12/23/2019  Post-op Rx sent to pharmacy: keflex, norco, zofran  Patient was provided with the Breast Reduction Risks General Surgical Risks consent document and Pain Medication Agreement prior to their appointment.  They had adequate time to read through the risk consent documents and Pain Medication Agreement. We also discussed them in person together during this preop appointment. All of their questions were answered to their satisfaction.  Recommended calling if they have any further questions.  Risk consent form and Pain Medication Agreement to be scanned into patient's chart.  The risk that can be encountered with breast reduction were discussed and include the following but not limited to these:  Breast asymmetry, fluid accumulation, firmness of the breast, inability to breast feed, loss of nipple or areola, skin loss, decrease or no nipple sensation, fat necrosis of the breast tissue, bleeding, infection, healing delay.  There are risks of anesthesia, changes to skin sensation and injury to nerves or blood vessels.  The muscle can be temporarily or permanently injured.  You may have an allergic reaction to tape, suture, glue, blood products which can result in skin discoloration, swelling, pain, skin lesions, poor healing.  Any of these can lead to the need for revisonal surgery or stage procedures.  A reduction has potential to interfere with diagnostic procedures.  Nipple or breast piercing can increase risks of infection.  This procedure is best done when the breast is fully developed.   Changes in the breast will continue to occur over time.  Pregnancy can alter the outcomes of previous breast reduction surgery, weight gain and weigh loss can also effect the long term appearance.   The risks that can be encountered with and after liposuction were discussed and include the following but no limited to these:  Asymmetry, fluid accumulation, firmness of the area, fat necrosis with death of fat tissue, bleeding, infection, delayed healing, anesthesia risks, skin sensation changes, injury to structures including nerves, blood vessels, and muscles which may be temporary or permanent, allergies to tape, suture materials and glues, blood products, topical preparations or injected agents, skin and contour irregularities, skin discoloration and swelling, deep vein thrombosis, cardiac and pulmonary complications, pain, which may persist, persistent pain, recurrence of the lesion, poor healing of the incision, possible need for revisional surgery or staged procedures. Thiere can also be persistent swelling, poor wound healing, rippling or loose skin, worsening of cellulite, swelling, and thermal burn or heat injury from ultrasound with the ultrasound-assisted lipoplasty technique. Any change in weight fluctuations can alter the outcome.  The [place] was signed into law in 2016 which includes the topic of electronic health records.  This provides immediate access to information in MyChart.  This includes consultation notes, operative notes, office notes, lab results and pathology reports.  If you have any questions about what you read please let us know at your next visit or call us at the office.  We are right here with you.   Electronically signed by: Threasa Heads, PA-C 02/12/2020 10:03 AM

## 2020-02-11 NOTE — Progress Notes (Signed)
ICD-10-CM   1. Symptomatic mammary hypertrophy  N62       Patient ID: Regina Mcconnell, female    DOB: 11-Jul-1971, 49 y.o.   MRN: [id number]   History of Present Illness: Regina Mcconnell is a 49 y.o.  female  with a history of mammary hypoplasia.  She presents for preoperative evaluation for upcoming procedure, bilateral breast reduction with liposuction, scheduled for 03/03/2020 with Dr. Marla Roe.  Summary from previous visit: Patient's breasts are large and fairly symmetric with the left larger than the right.  Sternal to nipple distance on the right is 39 cm and the left is 42 cm.  The IMF distance is 25 cm.  She is 5 feet 4 inches tall and weighs 246 pounds.  Preoperative bra size is 48 G.  She would like to be a B or C cup.  The estimated excess breast tissue to be removed at the time of surgery is 900 g on each side.  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   The patient has had problems with anesthesia. Nausea - would like the nausea patch for surgery  Past Medical History: Allergies: No Known Allergies  Current Medications:  Current Outpatient Medications:  .  triamcinolone cream (KENALOG) 0.1 %, Apply 1 application topically 2 (two) times daily., Disp: 80 g, Rfl: 0 .  valACYclovir (VALTREX) 1000 MG tablet, Take 2 tablets (2,000 mg total) by mouth 2 (two) times daily. For one day for outbreaks., Disp: 12 tablet, Rfl: 5  Past Medical Problems: Past Medical History:  Diagnosis Date  . Abnormal Pap smear of cervix 1995   --hx colposcopy w/bx and cryotherapy of cervix(paps normal since)  . Abnormal uterine bleeding    reason for ablation in 2009  . Heart murmur   . Urinary incontinence    especially with exercise    Past Surgical History: Past Surgical History:  Procedure Laterality Date  . DILATION AND CURETTAGE OF UTERUS     due to  heavy cycles  . ENDOMETRIAL ABLATION  2009   Dr. Quincy Simmonds    Social History: Social History   Socioeconomic History  . Marital status: Married    Spouse name: Francee Piccolo  . Number of children: 1  . Years of education: Assoc degr  . Highest education level: Not on file  Occupational History  . Occupation: Counsellor: [employer]  Tobacco Use  . Smoking status: Never Smoker  . Smokeless tobacco: Never Used  Substance and Sexual Activity  . Alcohol use: Yes    Alcohol/week: 0.0 standard drinks    Comment: once or twice a year  . Drug use: No  . Sexual activity: Yes    Partners: Male    Birth control/protection: None  Other Topics Concern  . Not on file  Social History Narrative   Working out 1 hour a day.  1-2 caffeine drinks per day.     Social Determinants of Health   Financial Resource Strain:   . Difficulty of Paying Living Expenses:   Food Insecurity:   . Worried About Charity fundraiser in the Last Year:   . Arboriculturist in the Last Year:   Transportation Needs:   . Film/video editor (Medical):   Marland Kitchen Lack of Transportation (Non-Medical):   Physical Activity:   .  Days of Exercise per Week:   . Minutes of Exercise per Session:   Stress:   . Feeling of Stress :   Social Connections:   . Frequency of Communication with Friends and Family:   . Frequency of Social Gatherings with Friends and Family:   . Attends Religious Services:   . Active Member of Clubs or Organizations:   . Attends Archivist Meetings:   Marland Kitchen Marital Status:   Intimate Partner Violence:   . Fear of Current or Ex-Partner:   . Emotionally Abused:   Marland Kitchen Physically Abused:   . Sexually Abused:     Family History: Family History  Problem Relation Age of Onset  . Diabetes Mother   . Thyroid disease Mother   . Heart disease Father   . Heart disease Paternal Uncle        all deceased from heart attacks  . Stroke Paternal Grandmother   . Breast cancer Neg Hx      Review of Systems: Review of Systems  Constitutional: Negative for chills and fever.  HENT: Negative for congestion and sore throat.   Respiratory: Negative for cough and shortness of breath.   Cardiovascular: Negative for chest pain and palpitations.  Gastrointestinal: Negative for abdominal pain, nausea and vomiting.  Musculoskeletal: Positive for back pain and neck pain. Negative for joint pain and myalgias.  Skin: Negative for itching and rash.    Physical Exam: Vital Signs BP 135/73 (BP Location: Left Arm, Patient Position: Sitting, Cuff Size: Large)   Pulse 84   Temp 98.4 F (36.9 C) (Temporal)   Ht 5\' 4"  (1.626 m)   Wt 238 lb (108 kg)   SpO2 100%   BMI 40.85 kg/m  Physical Exam Constitutional:      Appearance: Normal appearance. She is normal weight.  HENT:     Head: Normocephalic and atraumatic.  Eyes:     Extraocular Movements: Extraocular movements intact.  Cardiovascular:     Rate and Rhythm: Normal rate and regular rhythm.     Pulses: Normal pulses.     Heart sounds: Normal heart sounds.  Pulmonary:     Effort: Pulmonary effort is normal.     Breath sounds: Normal breath sounds. No wheezing, rhonchi or rales.  Abdominal:     General: Bowel sounds are normal.     Palpations: Abdomen is soft.  Musculoskeletal:        General: No swelling. Normal range of motion.     Cervical back: Normal range of motion.  Skin:    General: Skin is warm and dry.     Coloration: Skin is not pale.     Findings: No erythema or rash.  Neurological:     General: No focal deficit present.     Mental Status: She is alert and oriented to person, place, and time.  Psychiatric:        Mood and Affect: Mood normal.        Behavior: Behavior normal.        Thought Content: Thought content normal.        Judgment: Judgment normal.     Assessment/Plan:  Ms. Daughtrey scheduled for bilateral breast reduction with liposuction with Dr. Marla Roe.  Risks, benefits, and alternatives  of procedure discussed, questions answered and consent obtained.    Smoking Status: non-smoker Last Mammogram: 05/14/2019; Results: No findings suspicious for malignancy  Caprini Score: 5 High; Risk Factors include: 49 year old female, BMI > 25, varicose veins, and length of planned  surgery. Recommendation for mechanical and pharmacological prophylaxis during surgery. Encourage early ambulation.   Pictures obtained: 12/23/2019  Post-op Rx sent to pharmacy: keflex, norco, zofran  Patient was provided with the Breast Reduction Risks General Surgical Risks consent document and Pain Medication Agreement prior to their appointment.  They had adequate time to read through the risk consent documents and Pain Medication Agreement. We also discussed them in person together during this preop appointment. All of their questions were answered to their satisfaction.  Recommended calling if they have any further questions.  Risk consent form and Pain Medication Agreement to be scanned into patient's chart.  The risk that can be encountered with breast reduction were discussed and include the following but not limited to these:  Breast asymmetry, fluid accumulation, firmness of the breast, inability to breast feed, loss of nipple or areola, skin loss, decrease or no nipple sensation, fat necrosis of the breast tissue, bleeding, infection, healing delay.  There are risks of anesthesia, changes to skin sensation and injury to nerves or blood vessels.  The muscle can be temporarily or permanently injured.  You may have an allergic reaction to tape, suture, glue, blood products which can result in skin discoloration, swelling, pain, skin lesions, poor healing.  Any of these can lead to the need for revisonal surgery or stage procedures.  A reduction has potential to interfere with diagnostic procedures.  Nipple or breast piercing can increase risks of infection.  This procedure is best done when the breast is fully  developed.  Changes in the breast will continue to occur over time.  Pregnancy can alter the outcomes of previous breast reduction surgery, weight gain and weigh loss can also effect the long term appearance.   The risks that can be encountered with and after liposuction were discussed and include the following but no limited to these:  Asymmetry, fluid accumulation, firmness of the area, fat necrosis with death of fat tissue, bleeding, infection, delayed healing, anesthesia risks, skin sensation changes, injury to structures including nerves, blood vessels, and muscles which may be temporary or permanent, allergies to tape, suture materials and glues, blood products, topical preparations or injected agents, skin and contour irregularities, skin discoloration and swelling, deep vein thrombosis, cardiac and pulmonary complications, pain, which may persist, persistent pain, recurrence of the lesion, poor healing of the incision, possible need for revisional surgery or staged procedures. Thiere can also be persistent swelling, poor wound healing, rippling or loose skin, worsening of cellulite, swelling, and thermal burn or heat injury from ultrasound with the ultrasound-assisted lipoplasty technique. Any change in weight fluctuations can alter the outcome.  The [place] was signed into law in 2016 which includes the topic of electronic health records.  This provides immediate access to information in MyChart.  This includes consultation notes, operative notes, office notes, lab results and pathology reports.  If you have any questions about what you read please let us know at your next visit or call us at the office.  We are right here with you.   Electronically signed by: Threasa Heads, PA-C 02/12/2020 10:03 AM

## 2020-02-12 ENCOUNTER — Other Ambulatory Visit: Payer: Self-pay

## 2020-02-12 ENCOUNTER — Encounter: Payer: Self-pay | Admitting: Plastic Surgery

## 2020-02-12 ENCOUNTER — Ambulatory Visit (INDEPENDENT_AMBULATORY_CARE_PROVIDER_SITE_OTHER): Payer: No Typology Code available for payment source | Admitting: Plastic Surgery

## 2020-02-12 VITALS — BP 135/73 | HR 84 | Temp 98.4°F | Ht 64.0 in | Wt 238.0 lb

## 2020-02-12 DIAGNOSIS — N62 Hypertrophy of breast: Secondary | ICD-10-CM

## 2020-02-12 MED ORDER — ONDANSETRON HCL 4 MG PO TABS: 4.0000 mg | ORAL_TABLET | Freq: Three times a day (TID) | ORAL | 0 refills | Status: DC | PRN

## 2020-02-12 MED ORDER — HYDROCODONE-ACETAMINOPHEN 5-325 MG PO TABS: 1.0000 | ORAL_TABLET | Freq: Three times a day (TID) | ORAL | 0 refills | Status: AC | PRN

## 2020-02-12 MED ORDER — CEPHALEXIN 500 MG PO CAPS: 500.0000 mg | ORAL_CAPSULE | Freq: Four times a day (QID) | ORAL | 0 refills | Status: AC

## 2020-02-12 MED FILL — ONDANSETRON HCL 4 MG TABLET: 4 | 7 days supply | Qty: 20 | Fill #0

## 2020-02-12 MED FILL — HYDROCODON-APAP 5-325: 5-325 | 7 days supply | Qty: 21 | Fill #0

## 2020-02-12 MED FILL — CEPHALEXIN 500 MG CAPSULE: 500 | 3 days supply | Qty: 12 | Fill #0

## 2020-02-19 MED FILL — ONDANSETRON HCL 4 MG TABLET: 4 | 7 days supply | Qty: 20 | Fill #0

## 2020-02-19 MED FILL — HYDROCODON-APAP 5-325: 5-325 | 7 days supply | Qty: 21 | Fill #0

## 2020-02-23 ENCOUNTER — Encounter (HOSPITAL_BASED_OUTPATIENT_CLINIC_OR_DEPARTMENT_OTHER): Payer: Self-pay | Admitting: Plastic Surgery

## 2020-02-23 ENCOUNTER — Other Ambulatory Visit: Payer: Self-pay

## 2020-03-02 ENCOUNTER — Other Ambulatory Visit (HOSPITAL_COMMUNITY)
Admission: RE | Admit: 2020-03-02 | Discharge: 2020-03-02 | Disposition: A | Discharge status: Home / Self Care | Payer: No Typology Code available for payment source | Source: Ambulatory Visit | Attending: Plastic Surgery | Admitting: Plastic Surgery

## 2020-03-02 ENCOUNTER — Encounter (HOSPITAL_BASED_OUTPATIENT_CLINIC_OR_DEPARTMENT_OTHER): Payer: Self-pay | Admitting: Plastic Surgery

## 2020-03-02 DIAGNOSIS — Z20822 Contact with and (suspected) exposure to covid-19: Secondary | ICD-10-CM | POA: Insufficient documentation

## 2020-03-02 DIAGNOSIS — Z01812 Encounter for preprocedural laboratory examination: Secondary | ICD-10-CM | POA: Insufficient documentation

## 2020-03-02 LAB — SARS CORONAVIRUS 2 (TAT 6-24 HRS): SARS Coronavirus 2: NEGATIVE

## 2020-03-02 NOTE — Anesthesia Preprocedure Evaluation (Addendum)
Anesthesia Evaluation  Patient identified by MRN, date of birth, ID band Patient awake    Reviewed: Allergy & Precautions, NPO status , Patient's Chart, lab work & pertinent test results  History of Anesthesia Complications (+) PONV and history of anesthetic complications  Airway Mallampati: II  TM Distance: >3 FB Neck ROM: Full    Dental no notable dental hx. (+) Teeth Intact   Pulmonary neg pulmonary ROS,    Pulmonary exam normal breath sounds clear to auscultation       Cardiovascular negative cardio ROS Normal cardiovascular exam+ Valvular Problems/Murmurs  Rhythm:Regular Rate:Normal     Neuro/Psych negative neurological ROS  negative psych ROS   GI/Hepatic negative GI ROS, Neg liver ROS,   Endo/Other  Morbid obesityHyperlipidemia Mammary hypertrophy  Renal/GU negative Renal ROS Bladder dysfunction  Urinary incontinence    Musculoskeletal negative musculoskeletal ROS (+)   Abdominal (+) + obese,   Peds  Hematology   Anesthesia Other Findings   Reproductive/Obstetrics                            Anesthesia Physical Anesthesia Plan  ASA: III  Anesthesia Plan: General   Post-op Pain Management:    Induction: Intravenous  PONV Risk Score and Plan: 4 or greater and Scopolamine patch - Pre-op, Midazolam, Dexamethasone, Ondansetron and Treatment may vary due to age or medical condition  Airway Management Planned: LMA and Oral ETT  Additional Equipment:   Intra-op Plan:   Post-operative Plan: Extubation in OR  Informed Consent: I have reviewed the patients History and Physical, chart, labs and discussed the procedure including the risks, benefits and alternatives for the proposed anesthesia with the patient or authorized representative who has indicated his/her understanding and acceptance.     Dental advisory given  Plan Discussed with: CRNA and Surgeon  Anesthesia Plan  Comments:       Anesthesia Quick Evaluation

## 2020-03-03 ENCOUNTER — Encounter (HOSPITAL_BASED_OUTPATIENT_CLINIC_OR_DEPARTMENT_OTHER)
Admission: RE | Disposition: A | Discharge status: Home / Self Care | Payer: Self-pay | Source: Home / Self Care | Attending: Plastic Surgery

## 2020-03-03 ENCOUNTER — Ambulatory Visit (HOSPITAL_BASED_OUTPATIENT_CLINIC_OR_DEPARTMENT_OTHER)
Admission: RE | Admit: 2020-03-03 | Discharge: 2020-03-03 | Disposition: A | Discharge status: Home / Self Care | Payer: No Typology Code available for payment source | Attending: Plastic Surgery | Admitting: Plastic Surgery

## 2020-03-03 ENCOUNTER — Other Ambulatory Visit: Payer: Self-pay

## 2020-03-03 ENCOUNTER — Ambulatory Visit (HOSPITAL_BASED_OUTPATIENT_CLINIC_OR_DEPARTMENT_OTHER): Payer: No Typology Code available for payment source | Admitting: Anesthesiology

## 2020-03-03 ENCOUNTER — Encounter (HOSPITAL_BASED_OUTPATIENT_CLINIC_OR_DEPARTMENT_OTHER): Payer: Self-pay | Admitting: Plastic Surgery

## 2020-03-03 DIAGNOSIS — N62 Hypertrophy of breast: Secondary | ICD-10-CM | POA: Insufficient documentation

## 2020-03-03 DIAGNOSIS — R32 Unspecified urinary incontinence: Secondary | ICD-10-CM | POA: Diagnosis not present

## 2020-03-03 DIAGNOSIS — G8929 Other chronic pain: Secondary | ICD-10-CM

## 2020-03-03 DIAGNOSIS — M549 Dorsalgia, unspecified: Secondary | ICD-10-CM | POA: Diagnosis not present

## 2020-03-03 DIAGNOSIS — Z79899 Other long term (current) drug therapy: Secondary | ICD-10-CM | POA: Insufficient documentation

## 2020-03-03 DIAGNOSIS — M542 Cervicalgia: Secondary | ICD-10-CM | POA: Insufficient documentation

## 2020-03-03 DIAGNOSIS — M546 Pain in thoracic spine: Secondary | ICD-10-CM

## 2020-03-03 HISTORY — DX: Nausea with vomiting, unspecified: R11.2

## 2020-03-03 HISTORY — PX: BREAST REDUCTION SURGERY: SHX8

## 2020-03-03 HISTORY — DX: Other specified postprocedural states: Z98.890

## 2020-03-03 LAB — POCT PREGNANCY, URINE: Preg Test, Ur: NEGATIVE

## 2020-03-03 SURGERY — BREAST REDUCTION WITH LIPOSUCTION
Anesthesia: General | Site: Breast | Laterality: Bilateral

## 2020-03-03 MED ORDER — OXYCODONE HCL 5 MG PO TABS
5.0000 mg | ORAL_TABLET | Freq: Once | ORAL | Status: AC | PRN
  Administered 2020-03-03: 5 mg via ORAL

## 2020-03-03 MED ORDER — PHENYLEPHRINE 40 MCG/ML (10ML) SYRINGE FOR IV PUSH (FOR BLOOD PRESSURE SUPPORT)
PREFILLED_SYRINGE | INTRAVENOUS | Status: AC
  Filled 2020-03-03: qty 10

## 2020-03-03 MED ORDER — SUGAMMADEX SODIUM 500 MG/5ML IV SOLN
INTRAVENOUS | Status: AC
  Filled 2020-03-03: qty 5

## 2020-03-03 MED ORDER — ONDANSETRON HCL 4 MG/2ML IJ SOLN: 4.0000 mg | Freq: Once | INTRAMUSCULAR | Status: DC | PRN

## 2020-03-03 MED ORDER — LIDOCAINE HCL (CARDIAC) PF 100 MG/5ML IV SOSY
PREFILLED_SYRINGE | INTRAVENOUS | Status: DC | PRN
  Administered 2020-03-03: 80 mg via INTRAVENOUS

## 2020-03-03 MED ORDER — DEXAMETHASONE SODIUM PHOSPHATE 4 MG/ML IJ SOLN
INTRAMUSCULAR | Status: DC | PRN
  Administered 2020-03-03: 10 mg via INTRAVENOUS

## 2020-03-03 MED ORDER — MIDAZOLAM HCL 2 MG/2ML IJ SOLN
INTRAMUSCULAR | Status: AC
  Filled 2020-03-03: qty 2

## 2020-03-03 MED ORDER — ACETAMINOPHEN 325 MG PO TABS: 650.0000 mg | ORAL_TABLET | ORAL | Status: DC | PRN

## 2020-03-03 MED ORDER — DIPHENHYDRAMINE HCL 50 MG/ML IJ SOLN
INTRAMUSCULAR | Status: AC
  Filled 2020-03-03: qty 1

## 2020-03-03 MED ORDER — PHENYLEPHRINE HCL (PRESSORS) 10 MG/ML IV SOLN
INTRAVENOUS | Status: DC | PRN
Start: 2020-03-03 — End: 2020-03-03
  Administered 2020-03-03: 80 ug via INTRAVENOUS
  Administered 2020-03-03 (×2): 40 ug via INTRAVENOUS
  Administered 2020-03-03: 80 ug via INTRAVENOUS

## 2020-03-03 MED ORDER — ACETAMINOPHEN 325 MG RE SUPP: 650.0000 mg | RECTAL | Status: DC | PRN

## 2020-03-03 MED ORDER — FENTANYL CITRATE (PF) 100 MCG/2ML IJ SOLN
INTRAMUSCULAR | Status: AC
  Filled 2020-03-03: qty 2

## 2020-03-03 MED ORDER — LIDOCAINE HCL 1 % IJ SOLN
INTRAMUSCULAR | Status: DC | PRN
  Administered 2020-03-03: 50 mL

## 2020-03-03 MED ORDER — ROCURONIUM BROMIDE 10 MG/ML (PF) SYRINGE
PREFILLED_SYRINGE | INTRAVENOUS | Status: AC
  Filled 2020-03-03: qty 10

## 2020-03-03 MED ORDER — CHLORHEXIDINE GLUCONATE CLOTH 2 % EX PADS: 6.0000 | MEDICATED_PAD | Freq: Once | CUTANEOUS | Status: DC

## 2020-03-03 MED ORDER — EPINEPHRINE PF 1 MG/ML IJ SOLN
INTRAMUSCULAR | Status: DC | PRN
  Administered 2020-03-03: 1 mg

## 2020-03-03 MED ORDER — BUPIVACAINE HCL (PF) 0.25 % IJ SOLN
INTRAMUSCULAR | Status: AC
  Filled 2020-03-03: qty 30

## 2020-03-03 MED ORDER — SODIUM CHLORIDE 0.9 % IV SOLN
INTRAVENOUS | Status: AC
  Filled 2020-03-03: qty 500000

## 2020-03-03 MED ORDER — LACTATED RINGERS IV SOLN
INTRAVENOUS | Status: AC | PRN
  Administered 2020-03-03: 1

## 2020-03-03 MED ORDER — PROPOFOL 10 MG/ML IV BOLUS
INTRAVENOUS | Status: DC | PRN
  Administered 2020-03-03: 200 mg via INTRAVENOUS

## 2020-03-03 MED ORDER — ONDANSETRON HCL 4 MG/2ML IJ SOLN
INTRAMUSCULAR | Status: AC
  Filled 2020-03-03: qty 2

## 2020-03-03 MED ORDER — LIDOCAINE HCL (PF) 1 % IJ SOLN
INTRAMUSCULAR | Status: AC
  Filled 2020-03-03: qty 30

## 2020-03-03 MED ORDER — SUCCINYLCHOLINE CHLORIDE 200 MG/10ML IV SOSY
PREFILLED_SYRINGE | INTRAVENOUS | Status: AC
  Filled 2020-03-03: qty 10

## 2020-03-03 MED ORDER — SODIUM CHLORIDE 0.9% FLUSH: 3.0000 mL | INTRAVENOUS | Status: DC | PRN

## 2020-03-03 MED ORDER — EPHEDRINE 5 MG/ML INJ
INTRAVENOUS | Status: AC
  Filled 2020-03-03: qty 10

## 2020-03-03 MED ORDER — EPHEDRINE SULFATE 50 MG/ML IJ SOLN
INTRAMUSCULAR | Status: DC | PRN
  Administered 2020-03-03: 10 mg via INTRAVENOUS

## 2020-03-03 MED ORDER — SODIUM CHLORIDE 0.9 % IV SOLN
INTRAVENOUS | Status: DC | PRN
  Administered 2020-03-03: 500 mL

## 2020-03-03 MED ORDER — MEPERIDINE HCL 25 MG/ML IJ SOLN: 6.2500 mg | INTRAMUSCULAR | Status: DC | PRN

## 2020-03-03 MED ORDER — LACTATED RINGERS IV SOLN: INTRAVENOUS | Status: DC

## 2020-03-03 MED ORDER — SODIUM CHLORIDE 0.9 % IV SOLN: 250.0000 mL | INTRAVENOUS | Status: DC | PRN

## 2020-03-03 MED ORDER — OXYCODONE HCL 5 MG/5ML PO SOLN: 5.0000 mg | Freq: Once | ORAL | Status: AC | PRN

## 2020-03-03 MED ORDER — CEFAZOLIN SODIUM-DEXTROSE 2-4 GM/100ML-% IV SOLN
2.0000 g | INTRAVENOUS | Status: AC
  Administered 2020-03-03: 2 g via INTRAVENOUS

## 2020-03-03 MED ORDER — SCOPOLAMINE 1 MG/3DAYS TD PT72
MEDICATED_PATCH | TRANSDERMAL | Status: AC
  Filled 2020-03-03: qty 1

## 2020-03-03 MED ORDER — EPINEPHRINE PF 1 MG/ML IJ SOLN
INTRAMUSCULAR | Status: AC
  Filled 2020-03-03: qty 1

## 2020-03-03 MED ORDER — SCOPOLAMINE 1 MG/3DAYS TD PT72
1.0000 | MEDICATED_PATCH | TRANSDERMAL | Status: DC
  Administered 2020-03-03: 1.5 mg via TRANSDERMAL

## 2020-03-03 MED ORDER — OXYCODONE HCL 5 MG PO TABS: 5.0000 mg | ORAL_TABLET | ORAL | Status: DC | PRN

## 2020-03-03 MED ORDER — OXYCODONE HCL 5 MG PO TABS
ORAL_TABLET | ORAL | Status: AC
  Filled 2020-03-03: qty 1

## 2020-03-03 MED ORDER — MORPHINE SULFATE (PF) 4 MG/ML IV SOLN: 2.0000 mg | INTRAVENOUS | Status: DC | PRN

## 2020-03-03 MED ORDER — SODIUM CHLORIDE 0.9% FLUSH: 3.0000 mL | Freq: Two times a day (BID) | INTRAVENOUS | Status: DC

## 2020-03-03 MED ORDER — CEFAZOLIN SODIUM-DEXTROSE 2-4 GM/100ML-% IV SOLN
INTRAVENOUS | Status: AC
  Filled 2020-03-03: qty 100

## 2020-03-03 MED ORDER — DEXAMETHASONE SODIUM PHOSPHATE 10 MG/ML IJ SOLN
INTRAMUSCULAR | Status: AC
  Filled 2020-03-03: qty 1

## 2020-03-03 MED ORDER — FENTANYL CITRATE (PF) 100 MCG/2ML IJ SOLN
INTRAMUSCULAR | Status: DC | PRN
  Administered 2020-03-03: 50 ug via INTRAVENOUS
  Administered 2020-03-03: 100 ug via INTRAVENOUS
  Administered 2020-03-03: 50 ug via INTRAVENOUS

## 2020-03-03 MED ORDER — LIDOCAINE 2% (20 MG/ML) 5 ML SYRINGE
INTRAMUSCULAR | Status: AC
  Filled 2020-03-03: qty 5

## 2020-03-03 MED ORDER — HYDROMORPHONE HCL 1 MG/ML IJ SOLN: 0.2500 mg | INTRAMUSCULAR | Status: DC | PRN

## 2020-03-03 MED ORDER — MIDAZOLAM HCL 5 MG/5ML IJ SOLN
INTRAMUSCULAR | Status: DC | PRN
  Administered 2020-03-03: 2 mg via INTRAVENOUS

## 2020-03-03 MED ORDER — LIDOCAINE-EPINEPHRINE 1 %-1:100000 IJ SOLN
INTRAMUSCULAR | Status: AC
  Filled 2020-03-03: qty 1

## 2020-03-03 MED ORDER — ROCURONIUM BROMIDE 100 MG/10ML IV SOLN
INTRAVENOUS | Status: DC | PRN
  Administered 2020-03-03: 30 mg via INTRAVENOUS
  Administered 2020-03-03: 60 mg via INTRAVENOUS

## 2020-03-03 SURGICAL SUPPLY — 70 items
ADH SKN CLS APL DERMABOND .7 (GAUZE/BANDAGES/DRESSINGS)
BAG DECANTER FOR FLEXI CONT (MISCELLANEOUS) ×2 IMPLANT
BINDER BREAST 3XL (GAUZE/BANDAGES/DRESSINGS) ×2 IMPLANT
BINDER BREAST LRG (GAUZE/BANDAGES/DRESSINGS) IMPLANT
BINDER BREAST MEDIUM (GAUZE/BANDAGES/DRESSINGS) IMPLANT
BINDER BREAST XLRG (GAUZE/BANDAGES/DRESSINGS) IMPLANT
BINDER BREAST XXLRG (GAUZE/BANDAGES/DRESSINGS) IMPLANT
BIOPATCH RED 1 DISK 7.0 (GAUZE/BANDAGES/DRESSINGS) IMPLANT
BLADE HEX COATED 2.75 (ELECTRODE) ×2 IMPLANT
BLADE KNIFE PERSONA 10 (BLADE) ×4 IMPLANT
BLADE SURG 15 STRL LF DISP TIS (BLADE) IMPLANT
BLADE SURG 15 STRL SS (BLADE)
BNDG GAUZE ELAST 4 BULKY (GAUZE/BANDAGES/DRESSINGS) IMPLANT
CANISTER SUCT 1200ML W/VALVE (MISCELLANEOUS) ×2 IMPLANT
COVER BACK TABLE 60X90IN (DRAPES) ×2 IMPLANT
COVER MAYO STAND STRL (DRAPES) ×2 IMPLANT
COVER WAND RF STERILE (DRAPES) IMPLANT
DECANTER SPIKE VIAL GLASS SM (MISCELLANEOUS) IMPLANT
DERMABOND ADVANCED (GAUZE/BANDAGES/DRESSINGS)
DERMABOND ADVANCED .7 DNX12 (GAUZE/BANDAGES/DRESSINGS) IMPLANT
DRAIN CHANNEL 19F RND (DRAIN) ×4 IMPLANT
DRAPE LAPAROSCOPIC ABDOMINAL (DRAPES) ×2 IMPLANT
DRSG OPSITE POSTOP 4X6 (GAUZE/BANDAGES/DRESSINGS) ×8 IMPLANT
DRSG PAD ABDOMINAL 8X10 ST (GAUZE/BANDAGES/DRESSINGS) ×4 IMPLANT
ELECT BLADE 4.0 EZ CLEAN MEGAD (MISCELLANEOUS)
ELECT REM PT RETURN 9FT ADLT (ELECTROSURGICAL) ×2
ELECTRODE BLDE 4.0 EZ CLN MEGD (MISCELLANEOUS) IMPLANT
ELECTRODE REM PT RTRN 9FT ADLT (ELECTROSURGICAL) ×1 IMPLANT
EVACUATOR SILICONE 100CC (DRAIN) ×4 IMPLANT
GLOVE BIO SURGEON STRL SZ 6.5 (GLOVE) ×10 IMPLANT
GLOVE BIOGEL PI IND STRL 7.0 (GLOVE) ×1 IMPLANT
GLOVE BIOGEL PI INDICATOR 7.0 (GLOVE) ×1
GLOVE ECLIPSE 6.5 STRL STRAW (GLOVE) ×2 IMPLANT
GOWN STRL REUS W/ TWL LRG LVL3 (GOWN DISPOSABLE) ×2 IMPLANT
GOWN STRL REUS W/TWL LRG LVL3 (GOWN DISPOSABLE) ×4
NDL SAFETY ECLIPSE 18X1.5 (NEEDLE) ×1 IMPLANT
NEEDLE FILTER BLUNT 18X 1/2SAF (NEEDLE) ×1
NEEDLE FILTER BLUNT 18X1 1/2 (NEEDLE) ×1 IMPLANT
NEEDLE HYPO 18GX1.5 SHARP (NEEDLE) ×2
NEEDLE HYPO 25X1 1.5 SAFETY (NEEDLE) ×2 IMPLANT
NS IRRIG 1000ML POUR BTL (IV SOLUTION) ×2 IMPLANT
PACK BASIN DAY SURGERY FS (CUSTOM PROCEDURE TRAY) ×2 IMPLANT
PAD ALCOHOL SWAB (MISCELLANEOUS) IMPLANT
PENCIL SMOKE EVACUATOR (MISCELLANEOUS) ×2 IMPLANT
SLEEVE SCD COMPRESS KNEE MED (MISCELLANEOUS) ×2 IMPLANT
SPONGE LAP 18X18 RF (DISPOSABLE) ×4 IMPLANT
STRIP SUTURE WOUND CLOSURE 1/2 (MISCELLANEOUS) ×4 IMPLANT
SUT MNCRL AB 4-0 PS2 18 (SUTURE) ×14 IMPLANT
SUT MON AB 3-0 SH 27 (SUTURE) ×16
SUT MON AB 3-0 SH27 (SUTURE) ×8 IMPLANT
SUT MON AB 5-0 PS2 18 (SUTURE) ×8 IMPLANT
SUT PDS 3-0 CT2 (SUTURE)
SUT PDS AB 2-0 CT2 27 (SUTURE) IMPLANT
SUT PDS II 3-0 CT2 27 ABS (SUTURE) IMPLANT
SUT SILK 3 0 PS 1 (SUTURE) ×2 IMPLANT
SUT VIC AB 3-0 SH 27 (SUTURE)
SUT VIC AB 3-0 SH 27X BRD (SUTURE) IMPLANT
SUT VICRYL 4-0 PS2 18IN ABS (SUTURE) IMPLANT
SYR 3ML 23GX1 SAFETY (SYRINGE) ×2 IMPLANT
SYR 50ML LL SCALE MARK (SYRINGE) ×2 IMPLANT
SYR BULB IRRIG 60ML STRL (SYRINGE) ×2 IMPLANT
SYR CONTROL 10ML LL (SYRINGE) ×2 IMPLANT
TAPE MEASURE VINYL STERILE (MISCELLANEOUS) IMPLANT
TOWEL GREEN STERILE FF (TOWEL DISPOSABLE) ×6 IMPLANT
TRAY DSU PREP LF (CUSTOM PROCEDURE TRAY) ×2 IMPLANT
TUBE CONNECTING 20X1/4 (TUBING) ×2 IMPLANT
TUBING INFILTRATION IT-10001 (TUBING) ×2 IMPLANT
TUBING SET GRADUATE ASPIR 12FT (MISCELLANEOUS) ×2 IMPLANT
UNDERPAD 30X36 HEAVY ABSORB (UNDERPADS AND DIAPERS) ×4 IMPLANT
YANKAUER SUCT BULB TIP NO VENT (SUCTIONS) ×2 IMPLANT

## 2020-03-03 NOTE — Anesthesia Postprocedure Evaluation (Signed)
Anesthesia Post Note  Patient: Regina Mcconnell  Procedure(s) Performed: BREAST REDUCTION WITH LIPOSUCTION (Bilateral Breast)     Patient location during evaluation: PACU Anesthesia Type: General Level of consciousness: awake and alert and oriented Pain management: pain level controlled Vital Signs Assessment: post-procedure vital signs reviewed and stable Respiratory status: spontaneous breathing, nonlabored ventilation and respiratory function stable Cardiovascular status: blood pressure returned to baseline and stable Postop Assessment: no apparent nausea or vomiting Anesthetic complications: no   No complications documented.  Last Vitals:  Vitals:   03/03/20 1245 03/03/20 1315  BP: 96/60 108/64  Pulse: 83 95  Resp: 11 19  Temp:    SpO2: 100% 100%    Last Pain:  Vitals:   03/03/20 1315  TempSrc:   PainSc: 2                  [name] A.

## 2020-03-03 NOTE — Transfer of Care (Signed)
Immediate Anesthesia Transfer of Care Note  Patient: Regina Mcconnell  Procedure(s) Performed: BREAST REDUCTION WITH LIPOSUCTION (Bilateral Breast)  Patient Location: PACU  Anesthesia Type:General  Level of Consciousness: awake, alert  and oriented  Airway & Oxygen Therapy: Patient Spontanous Breathing and Patient connected to face mask oxygen  Post-op Assessment: Report given to RN and Post -op Vital signs reviewed and stable  Post vital signs: Reviewed and stable  Last Vitals:  Vitals Value Taken Time  BP 112/61 03/03/20 1218  Temp    Pulse 101 03/03/20 1219  Resp 17 03/03/20 1219  SpO2 100 % 03/03/20 1219  Vitals shown include unvalidated device data.  Last Pain:  Vitals:   03/03/20 0727  TempSrc: Oral  PainSc: 0-No pain         Complications: No complications documented.

## 2020-03-03 NOTE — Op Note (Signed)
Breast Reduction Op note:    DATE OF PROCEDURE: 03/03/2020  LOCATION: [place]  SURGEON: Lyndee Leo Sanger Tilley Faeth, DO  ASSISTANT: Phoebe Sharps, PA  PREOPERATIVE DIAGNOSIS 1. Macromastia 2. Neck Pain 3. Back Pain  POSTOPERATIVE DIAGNOSIS 1. Macromastia 2. Neck Pain 3. Back Pain  PROCEDURES 1. Bilateral breast reduction.  Right reduction 1808 g, Left reduction 9147W  COMPLICATIONS: None.  DRAINS: bilateral  INDICATIONS FOR PROCEDURE Regina Mcconnell is a 49 y.o. year old female born on [birth year], with a history of symptomatic macromastia with concominant back pain, neck pain, shoulder grooving from her bra.   MRN: [id number]  CONSENT Informed consent was obtained directly from the patient. The risks, benefits and alternatives were fully discussed. Specific risks including but not limited to bleeding, infection, hematoma, seroma, scarring, pain, nipple necrosis, asymmetry, poor cosmetic results, and need for further surgery were discussed.   The patient was aware her left breast was slightly larger than her right.  She also acknowledged that she was aware she may not be perky and said that she was doing the surgery for relief of pain not aesthetics. The patient had ample opportunity to have her questions answered to her satisfaction.  DESCRIPTION OF PROCEDURE  Patient was brought into the operating room and placed in a supine position.  SCDs were placed and appropriate padding was performed.  Antibiotics were given. The patient underwent general anesthesia and the chest was prepped and draped in a sterile fashion.  A timeout was performed and all information was confirmed to be correct. Tumescent was placed in the lateral breast tissue on each side.  This was done through a small lateral incision.  Right:  Preoperative markings were confirmed.  Incision lines were injected with 1% Xylocaine with epinephrine.  After waiting for vasoconstriction, the marked lines  were incised.  Liposuction was performed in the lateral breast area for improved contour.  An superior pedical breast reduction was performed by de-epithelializing the pedicle, using bovie to excise the inferior breast tissue.  Care was taken to not undermine the breast pedicle. The lateral flap was released for a 4 cm to allow for rotation to the middle of the breast and to elevate the NAC into position. Hemostasis was achieved.  The nipple was gently rotated into position and the skin was closed with the 3-0 Monocryl.  The patient was sat upright and size and shape symmetry was confirmed.  A drain was placed, and hemostasis confirmed. The drain was secured to the skin with a 3-0 Silk.  The deep tissues were approximated with 3-0 Monocryl sutures and the skin was closed with deep dermal and subcuticular 4-0 Monocryl sutures followed by 5-0 Monocryl.  The nipple areola complex was brought out and secured in place with the 3-0 Monocryl followed by the 4-0 and 5-0 Monocryl. The nipple and skin flaps had good capillary refill at the end of the procedure.   Left:  Preoperative markings were confirmed.  Incision lines were injected with 1% Xylocaine with epinephrine.  After waiting for vasoconstriction, the marked lines were incised.  Liposuction was performed in the lateral breast area for improved contour.  An superior pedical breast reduction was performed by de-epithelializing the pedicle, using bovie to excise the inferior breast tissue.  Care was taken to not undermine the breast pedicle. The lateral flap was released for a 4 cm to allow for rotation to the middle of the breast and to elevate the NAC into position. Hemostasis was achieved.  The nipple was gently rotated into position and the skin was closed with the 3-0 Monocryl.  The patient was sat upright and size and shape symmetry was confirmed.  A drain was placed, and hemostasis confirmed. The drain was secured to the skin with a 3-0 Silk.  The deep  tissues were approximated with 3-0 Monocryl sutures and the skin was closed with deep dermal and subcuticular 4-0 Monocryl sutures followed by 5-0 Monocryl.   The nipple areola complex was brought out and secured in place with the 3-0 Monocryl followed by the 4-0 and 5-0 Monocryl.  The area was secured with 4-0 Monocryl at the deep layers followed by 5-0 Monocryl.  The nipple and skin flaps had good capillary refill at the end of the procedure.  Dermabond and steri strips were applied.  A honeycomb dressing was applied. The patient tolerated the procedure well. The patient was allowed to wake from anesthesia and taken to the recovery room in satisfactory condition.  The advanced practice practitioner (APP) assisted throughout the case.  The APP was essential in retraction and counter traction when needed to make the case progress smoothly.  This retraction and assistance made it possible to see the tissue plans for the procedure.  The assistance was needed for blood control, tissue re-approximation and assisted with closure of the incision site.

## 2020-03-03 NOTE — Interval H&P Note (Signed)
History and Physical Interval Note:  03/03/2020 7:34 AM  Regina Mcconnell  has presented today for surgery, with the diagnosis of mammary hypertrophy.  The various methods of treatment have been discussed with the patient and family. After consideration of risks, benefits and other options for treatment, the patient has consented to  Procedure(s): BREAST REDUCTION WITH LIPOSUCTION (Bilateral) as a surgical intervention.  The patient's history has been reviewed, patient examined, no change in status, stable for surgery.  I have reviewed the patient's chart and labs.  Questions were answered to the patient's satisfaction.     Loel Lofty Joyice Magda

## 2020-03-03 NOTE — Anesthesia Procedure Notes (Addendum)
Procedure Name: Intubation Date/Time: 03/03/2020 8:35 AM Performed by: Willa Frater, CRNA Pre-anesthesia Checklist: Patient identified, Emergency Drugs available, Suction available and Patient being monitored Patient Re-evaluated:Patient Re-evaluated prior to induction Oxygen Delivery Method: Circle system utilized Preoxygenation: Pre-oxygenation with 100% oxygen Induction Type: IV induction Ventilation: Mask ventilation without difficulty Laryngoscope Size: Mac and 3 Grade View: Grade I Tube type: Oral Number of attempts: 1 Airway Equipment and Method: Stylet and Oral airway Placement Confirmation: ETT inserted through vocal cords under direct vision,  positive ETCO2 and breath sounds checked- equal and bilateral Secured at: 22 cm Tube secured with: Tape Dental Injury: Teeth and Oropharynx as per pre-operative assessment

## 2020-03-03 NOTE — Discharge Instructions (Addendum)
Oxycodone given at 2:15pm.   INSTRUCTIONS FOR AFTER BREAST SURGERY   You are getting ready to undergo breast surgery.  You will likely have some questions about what to expect following your operation.  The following information will help you and your family understand what to expect when you are discharged from the hospital.  Following these guidelines will help ensure a smooth recovery and reduce risks of complications.   Postoperative instructions include information on: diet, wound care, medications and physical activity.  AFTER SURGERY Expect to go home after the procedure.  In some cases, you may need to spend one night in the hospital for observation.  DIET Breast surgery does not require a specific diet.  However, the healthier you eat the better your body can start healing. It is important to increasing your protein intake.  This means limiting the foods with sugar and carbohydrates.  Focus on vegetables and some meat.  If you have any liposuction during your procedure be sure to drink water.  If your urine is bright yellow, then it is concentrated, and you need to drink more water.  As a general rule after surgery, you should have 8 ounces of water every hour while awake.  If you find you are persistently nauseated or unable to take in liquids let us know.  NO TOBACCO USE or EXPOSURE.  This will slow your healing process and increase the risk of a wound.  WOUND CARE If you have a drain: You can shower 3-5 days after surgery.  Clean with baby wipes until the drain is removed.    If you have steri-strips / tape directly attached to your skin leave them in place. It is OK to get these wet.  No baths, pools or hot tubs for two weeks. We close your incision to leave the smallest and best-looking scar. No ointment or creams on your incisions until given the go ahead.  Especially not Neosporin (Too many skin reactions with this one).  A few weeks after surgery you can use Mederma  and start massaging the scar. We ask you to wear your binder or sports bra for the first 6 weeks around the clock, including while sleeping. This provides added comfort and helps reduce the fluid accumulation at the surgery site.  ACTIVITY No heavy lifting until cleared by the doctor.  This usually means no more than a half-gallon of milk.  It is OK to walk and climb stairs. In fact, moving your legs is very important to decrease your risk of a blood clot.  It will also help keep you from getting deconditioned.  Every 1 to 2 hours get up and walk for 5 minutes. This will help with a quicker recovery back to normal.  Let pain be your guide so you don't do too much.  This is not the time for spring cleaning and don't plan on taking care of anyone else.  This time is for you to recover,  You will be more comfortable if you sleep and rest with your head elevated either with a few pillows under you or in a recliner.  No stomach sleeping for a three months.  WORK Everyone returns to work at different times. As a rough guide, most people take at least 1 - 2 weeks off prior to returning to work. If you need documentation for your job, bring the forms to your postoperative follow up visit.  DRIVING Arrange for someone to bring you home  from the hospital.  You may be able to drive a few days after surgery but not while taking any narcotics or valium.  BOWEL MOVEMENTS Constipation can occur after anesthesia and while taking pain medication.  It is important to stay ahead for your comfort.  We recommend taking Milk of Magnesia (2 tablespoons; twice a day) while taking the pain pills.  SEROMA This is fluid your body tried to put in the surgical site.  This is normal but if it creates tight skinny skin let us know.  It usually decreases in a few weeks.  MEDICATIONS and PAIN CONTROL At your preoperative visit for you history and physical you were given the following medications: 1. An antibiotic: Start this  medication when you get home and take according to the instructions on the bottle. 2. Zofran 4 mg:  This is to treat nausea and vomiting.  You can take this every 6 hours as needed and only if needed. 3. Valium 2 mg: This is for muscle tightness if you have an implant or expander. This will help relax your muscle which also helps with pain control.  This can be taken every 12 hours as needed.  Don't drive after taking this medication. 4. Norco (hydrocodone/acetaminophen) 5/325 mg:  This is only to be used after you have taken the motrin or the tylenol. Every 8 hours as needed. Over the counter Medication to take: 5. Ibuprofen (Motrin) 600 mg:  Take this every 6 hours.  If you have additional pain then take 500 mg of the tylenol every 8 hours.  Only take the Norco after you have tried these two. 6. Miralax or stool softener of choice: Take this according to the bottle if you take the [place] Call your surgeon's office if any of the following occur: . Fever 101 degrees F or greater . Excessive bleeding or fluid from the incision site. . Pain that increases over time without aid from the medications . Redness, warmth, or pus draining from incision sites . Persistent nausea or inability to take in liquids . Severe misshapen area that underwent the operation.   [facility] Plastic Surgery Specialist  What is the benefit of having a drain?  During surgery your tissue layers are separated.  This raw surface stimulates your body to fill the space with serous fluid.  This is normal but you don't want that fluid to collect and prevent healing.  A fluid collection can also become infected.  The Jackson-Pratt (JP) drain is used to eliminate this collection of fluid and allow the tissue to heal together.    Jackson-Pratt (JP) bulb    How to care for your drainage and suction unit at home Your drainage catheter will be connected to a collection device. The vacuum caused when the device is  compressed allows drainage to collect in the device.    Wendee Copp your hands with soap and water before and after touching the system. . Empty the JP drain every 12 hours once you get home from your procedure. . Record the fluid amount on the record sheet included. . Start with stripping the drain tube to push the clots or excess fluid to the bulb.  Do this by pinching the tube with one hand near your skin.  Then with the other hand squeeze the tubing and work it toward the bulb.  This should be done several times a day.  This may collapse the tube which will correct on its own.   Marland Kitchen  Use a safety pin to attach your collection device to your clothing so there is no tension on the insertion site.   . If you have drainage at the skin insertion site, you can apply a gauze dressing and secure it with tape. . If the drain falls out, apply a gauze dressing over the drain insertion site and secure with tape.   To empty the collection device:   . Release the stopper on the top of the collection unit (bulb).  Signa Kell contents into a measuring container such as a plastic medicine cup.  . Record the day and amount of drainage on the attached sheet. . This should be done at least twice a day.    To compress the Jackson-Pratt Bulb:  . Release the stopper at the top of the bulb. Marland Kitchen Squeeze the bulb tightly in your fist, squeezing air out of the bulb.  . Replace the stopper while the bulb is compressed.  . Be careful not to spill the contents when squeezing the bulb. . The drainage will start bright red and turn to pink and then yellow with time. . IMPORTANT: If the bulb is not squeezed before adding the stopper it will not draw out the fluid.  Care for the JP drain site and your skin daily:  . You may shower three days after surgery. . Secure the drain to a ribbon or cloth around your waist while showering so it does not pull out while showering. . Be sure your hands are cleaned with soap and water. . Use a  clean wet cotton swab to clean the skin around the drain site.  . Use another cotton swab to place Vaseline or antibiotic ointment on the skin around the drain.     Contact your physician if any of the following occur:  Marland Kitchen The fluid in the bulb becomes cloudy. . Your temperature is greater than 101.4.  Marland Kitchen The incision opens. . If you have drainage at the skin insertion site, you can apply a gauze dressing and secure it with tape. . If the drain falls out, apply a gauze dressing over the drain insertion site and secure with tape.  . You will usually have more drainage when you are active than while you rest or are asleep. If the drainage increases significantly or is bloody call the physician                             Bring this record with you to each office visit Date  Drainage Volume  Date   Drainage volume  Post Anesthesia Home Care Instructions  Activity: Get plenty of rest for the remainder of the day. A responsible individual must stay with you for 24 hours following the procedure.  For the next 24 hours, DO NOT: -Drive a car -Paediatric nurse -Drink alcoholic beverages -Take any medication unless instructed by your physician -Make any legal decisions or sign important papers.  Meals: Start with liquid foods such as gelatin or soup. Progress to regular foods as tolerated. Avoid greasy, spicy, heavy foods. If nausea and/or vomiting occur, drink only clear liquids until the nausea and/or vomiting subsides. Call your physician if vomiting continues.  Special Instructions/Symptoms: Your throat may feel dry or sore from the anesthesia or the breathing tube placed in your throat during surgery. If this causes discomfort, gargle with warm salt water. The discomfort should  disappear within 24 hours.  If you had a scopolamine patch placed behind your ear for the management of post- operative nausea and/or vomiting:  1. The medication in the patch is effective for 72 hours, after which it should be removed.  Wrap patch in a tissue and discard in the trash. Wash hands thoroughly with soap and water. 2. You may remove the patch earlier than 72 hours if you experience unpleasant side effects which may include dry mouth, dizziness or visual disturbances. 3. Avoid touching the patch. Wash your hands with soap and water after contact with the patch.

## 2020-03-04 ENCOUNTER — Encounter (HOSPITAL_BASED_OUTPATIENT_CLINIC_OR_DEPARTMENT_OTHER): Payer: Self-pay | Admitting: Plastic Surgery

## 2020-03-05 ENCOUNTER — Telehealth: Payer: Self-pay

## 2020-03-05 LAB — SURGICAL PATHOLOGY

## 2020-03-05 NOTE — Telephone Encounter (Signed)
call to pt re: her concern with increased pain right side/drain site.  Pain is intermittent & 6/10 on pain scale she reports that prior to today's c/o increased pain- she has not needed to take any narcotic pain meds- she has had relief with OTC Tylenol & Ibuprofen She did have temp 99.8 last night- but today it is normal @ 98.3/temporal C/o slight nausea with 1st dressing change-yesterday she relates this to seeing her incisions for the 1st time  no vomiting, she is eating & taking fluids- but appetite is decreased She is having normal bowel/bladder function. Drainage for last 24 hrs= (R)-63ml total & (L)-21ml total Pt reports she has been more active today & with activity there is increased pain at drain site (R) I had pt's daughter look at both drains & she reports that both remain intact & sutures are in place I consulted with Dr. Marla Roe & she suggests pt use vaseline around the drain insertion sites.  She can also adjust the tubing to a more comfortable position & can use gauze pad under the tube.   Pt understands the plan of care. The pt will call for any changes or complications. She has f/u visit next Friday with Dr. Marla Roe

## 2020-03-05 NOTE — Telephone Encounter (Signed)
Patient's daughter, Katee Wentland called to state her mother's right drainage tube is causing significant pain. Please call Alyson Ingles on: [phone] as they have turned her phone off.

## 2020-03-12 ENCOUNTER — Encounter: Payer: Self-pay | Admitting: Plastic Surgery

## 2020-03-12 ENCOUNTER — Ambulatory Visit (INDEPENDENT_AMBULATORY_CARE_PROVIDER_SITE_OTHER): Payer: No Typology Code available for payment source | Admitting: Plastic Surgery

## 2020-03-12 ENCOUNTER — Other Ambulatory Visit: Payer: Self-pay

## 2020-03-12 VITALS — BP 111/69 | HR 51 | Temp 97.7°F

## 2020-03-12 DIAGNOSIS — N62 Hypertrophy of breast: Secondary | ICD-10-CM

## 2020-03-12 NOTE — Progress Notes (Signed)
° °  Subjective:    Patient ID: Regina Mcconnell, female    DOB: Jul 03, 1971, 49 y.o.   MRN: [id number]  The patient is a 49 year old female here for follow-up after undergoing bilateral breast reduction July 7.  We removed 1800 g from the right breast and 2400 g from the left breast.  She has a little bit of swelling and bruising still.  The drain output has been minimal.  There are slight hematomas at the epidermal dermal area of both nipples.  This will likely drain over the next few days.  There is no sign of infection.  Review of Systems  Constitutional: Positive for activity change. Negative for appetite change.  Eyes: Negative.   Respiratory: Negative.   Cardiovascular: Negative.   Genitourinary: Negative.   Skin: Positive for color change.  Hematological: Negative.   Psychiatric/Behavioral: Negative.        Objective:   Physical Exam Vitals and nursing note reviewed.  Constitutional:      Appearance: Normal appearance.  Cardiovascular:     Rate and Rhythm: Normal rate.     Pulses: Normal pulses.  Pulmonary:     Effort: Pulmonary effort is normal.  Neurological:     General: No focal deficit present.     Mental Status: She is alert. Mental status is at baseline.  Psychiatric:        Mood and Affect: Mood normal.        Behavior: Behavior normal.        Assessment & Plan:     ICD-10-CM   1. Symptomatic mammary hypertrophy  N62     Drains were removed on both sides.  I would like her to use Vaseline to the nipple areolas with gauze.  She can shower.  She can leave the adhesive dressings in place until her next visit.  If they start to come off then she can peel them off.

## 2020-03-25 NOTE — Progress Notes (Signed)
Patient is a 49 yr-old female here for follow up after undergoing a bilateral breast reduction on 03/03/20 with Dr. Marla Roe. (Right breast: 1800 g; Left Breast: 2400 g). At her last visit on 7/16 she had small hematomas at the epidermal dermal area of both nipples.   ~ 3 weeks PO Patient reports she is doing well overall.  Pain well controlled with just Tylenol and ibuprofen.  She has wounds where hematomas at the epidermal dermal area of both nipples were.  Wounds are healing well.  Some exudate present.  No signs of infection.  All other incisions are healing nicely, C/D/I.  Patient denies fever, chills, nausea/vomiting.  Continue to apply Vaseline to the NAC wounds and cover with gauze.  Continue to wear compression 24/7.  May shower avoiding direct water spray on the breasts.  Continue to avoid heavy lifting.  Follow-up in 2 weeks.  Call office with any questions/concerns.  The [place] was signed into law in 2016 which includes the topic of electronic health records.  This provides immediate access to information in MyChart.  This includes consultation notes, operative notes, office notes, lab results and pathology reports.  If you have any questions about what you read please let us know at your next visit or call us at the office.  We are right here with you.

## 2020-03-26 ENCOUNTER — Other Ambulatory Visit: Payer: Self-pay

## 2020-03-26 ENCOUNTER — Ambulatory Visit (INDEPENDENT_AMBULATORY_CARE_PROVIDER_SITE_OTHER): Payer: No Typology Code available for payment source | Admitting: Plastic Surgery

## 2020-03-26 ENCOUNTER — Encounter: Payer: Self-pay | Admitting: Plastic Surgery

## 2020-03-26 DIAGNOSIS — Z9889 Other specified postprocedural states: Secondary | ICD-10-CM | POA: Insufficient documentation

## 2020-03-29 ENCOUNTER — Encounter: Payer: Self-pay | Admitting: Plastic Surgery

## 2020-03-29 ENCOUNTER — Telehealth: Payer: Self-pay | Admitting: Plastic Surgery

## 2020-03-29 ENCOUNTER — Ambulatory Visit (INDEPENDENT_AMBULATORY_CARE_PROVIDER_SITE_OTHER): Payer: No Typology Code available for payment source | Admitting: Plastic Surgery

## 2020-03-29 ENCOUNTER — Telehealth: Payer: Self-pay

## 2020-03-29 ENCOUNTER — Other Ambulatory Visit: Payer: Self-pay

## 2020-03-29 VITALS — BP 127/77 | HR 81 | Temp 97.5°F

## 2020-03-29 DIAGNOSIS — Z9889 Other specified postprocedural states: Secondary | ICD-10-CM

## 2020-03-29 NOTE — Progress Notes (Signed)
Patient is a 49 year old female here for follow-up after undergoing a bilateral breast reduction on 03/03/2020 with Dr. Marla Roe.  (Right breast: 1800 g; left breast: 2400 g). At her last visit on 7/30 she had wounds of the NAC's bilaterally where small hematomas at the epidermal dermal area had been.  ~ 3.5 weeks PO Wounds on bilateral NACs have moderate exudate and yellow drainage. No signs of infection, redness, seroma/hematona. Patient denies F/C,N/V.   Cover bilateral NACs (4 x 5 cm) with Xeroform, gauze, and ABDs daily. Keep follow up appt in 2 weeks.  Call office with any questions/concerns or if condition worsens.   Pictures were obtained of the patient and placed in the chart with the patient's or guardian's permission.  The [place] was signed into law in 2016 which includes the topic of electronic health records.  This provides immediate access to information in MyChart.  This includes consultation notes, operative notes, office notes, lab results and pathology reports.  If you have any questions about what you read please let us know at your next visit or call us at the office.  We are right here with you.

## 2020-03-29 NOTE — Telephone Encounter (Signed)
Returned patients call.  She is coming in to see Venetia Night this afternoon to be assessed for the yellow discharge from her breast. Until then, advised her to continue using Vaseline and guaze until her appointment.  Patient agreed and understood.

## 2020-03-29 NOTE — Telephone Encounter (Signed)
Faxed medical supplies for Regina Mcconnell to [facility] for BL breast wounds; ABD-daily, gauze-daily, xeroform-daily

## 2020-03-29 NOTE — Telephone Encounter (Signed)
Patient called to advise that drainage from breast is now yellow. Appt scheduled for 2:20pm today just in case Venetia Night wants her to come in, but please call her to advise what she should do. Cell phone [phone]

## 2020-04-02 ENCOUNTER — Other Ambulatory Visit: Payer: Self-pay | Admitting: Physician Assistant

## 2020-04-02 MED ORDER — DOXYCYCLINE HYCLATE 100 MG PO TABS
100.0000 mg | ORAL_TABLET | Freq: Two times a day (BID) | ORAL | 0 refills | Status: DC
Start: 2020-04-02 — End: 2020-04-13

## 2020-04-02 MED FILL — DOXYCYCLINE HYCLATE 100 MG: 100 | 10 days supply | Qty: 20 | Fill #0

## 2020-04-02 NOTE — Progress Notes (Signed)
Cough/sinus drainage since breast reduction. Not improving with symptomatic care. Sent doxycycline.

## 2020-04-03 ENCOUNTER — Telehealth: Payer: Self-pay | Admitting: Plastic Surgery

## 2020-04-03 MED ORDER — DOXYCYCLINE HYCLATE 100 MG PO TABS
100.0000 mg | ORAL_TABLET | Freq: Two times a day (BID) | ORAL | 0 refills | Status: DC
Start: 2020-04-03 — End: 2020-04-13

## 2020-04-03 NOTE — Telephone Encounter (Signed)
Patient called with increase breast pain, redness and swelling.  concerned about possible infection.  Doxy sent to [facility].  Will have office call for earlier appointment. Patient agrees and knows to continue with increased water intake and Motrin.

## 2020-04-05 ENCOUNTER — Encounter: Payer: Self-pay | Admitting: Surgical

## 2020-04-05 ENCOUNTER — Ambulatory Visit (INDEPENDENT_AMBULATORY_CARE_PROVIDER_SITE_OTHER): Payer: No Typology Code available for payment source | Admitting: Surgical

## 2020-04-05 ENCOUNTER — Other Ambulatory Visit: Payer: Self-pay

## 2020-04-05 VITALS — BP 130/75 | HR 71 | Temp 98.2°F

## 2020-04-05 DIAGNOSIS — Z9889 Other specified postprocedural states: Secondary | ICD-10-CM

## 2020-04-05 NOTE — Progress Notes (Signed)
Patient is a 49 year old female here for follow-up after undergoing a bilateral breast reduction with Dr. Marla Roe on 03/03/2020.  She had developed small hematomas at the epidermal dermal area of both nipples that developed into wounds.  She has been applying Xeroform 4 x 4 gauze and ABD pads daily.  Patient reports this weekend she had increased breast pain, redness, swelling with concerns for infection and was prescribed doxycycline.  Patient reports she was also prescribed doxycycline by her PCP due to cough/sinus drainage.  At her visit on 04/05/2020 an attempt was made to aspirate fluid from her right breast, no fluid was able to be aspirated.  Necrotic and sloughing exudate was sharply excised from both left and right breast wounds.  Drainage from right breast was cultured.  ~ 5 week PO Patient presents today with husband.  Necrotic and sloughing exudate was excised from bilateral breast wounds.  A good amount of drainage was obtained from right breast wound by applying some pressure.  Patient reports she has been running fevers for several days and has a dry cough.  Patient denies nausea and vomiting.  Donated a cell was applied to bilateral breast wounds and covered with Adaptic, K-Y jelly, 4 x 4 gauze, and ABDs.  Leave ACell and Adaptic in place for 5 days.  Do daily application of K-Y jelly to Adaptic, cover with fresh 4 x 4 gauze and ABDs.  No showering until Monday.  On Monday May shower and cover bilateral breast wounds with Xeroform.  Follow-up in office in 1 week.  Call office with any questions/concerns.  The [place] was signed into law in 2016 which includes the topic of electronic health records.  This provides immediate access to information in MyChart.  This includes consultation notes, operative notes, office notes, lab results and pathology reports.  If you have any questions about what you read please let us know at your next visit or call us at the office.  We are right  here with you.

## 2020-04-05 NOTE — Progress Notes (Signed)
Subjective:     Patient ID: Regina Mcconnell, female    DOB: 04-12-1971, 49 y.o.   MRN: [id number]  Chief Complaint  Patient presents with  . Follow-up    HPI: The patient is a 49 y.o. female here for follow-up after bilateral breast reduction with Dr. Marla Roe on 03/03/2020.   Patient developed small hematomas at the epidermal dermal area of both nipples that was noted on 03/12/2020.  Subsequently patient had wounds develop at these locations and has been applying Xeroform daily with 4 x 4 gauze and ABD pad to assist with collection of drainage.  Patient was last seen on 03/29/2020 and wounds were noted to have moderate exudate and yellow drainage.  Patient called this weekend with increased breast pain, redness, swelling with concerns for infection and was prescribed doxycycline and advised to be evaluated in the office today.  Patient reports that she was also prescribed doxycycline by her PCP due to cough/sinus drainage.  Patient today reports that starting Friday she developed fevers, chills.  She has had measurable fevers as high as 102 F. Today in the office temperature is 98.2 F.  Patient reports that since being on doxycycline she has been feeling somewhat better.  She has been drinking plenty of fluids.  She reports that she has had looser bowel movements since starting doxycycline.  She is having normal bladder and bowel function.  She is not having any chest pain or shortness of breath.  She does report a dry cough, this has been ongoing since surgery, denies any sputum production.   Patient reports that she has had chills every night since Friday, she has also noticed increased pain in her left breast as well as redness.  She has also noticed increased drainage from bilateral breasts, reports drainage is yellowish and brownish.   Review of Systems  Constitutional: Positive for activity change, chills and fever.  Respiratory: Positive for cough. Negative for shortness of breath.     Cardiovascular: Negative for chest pain and leg swelling.  Gastrointestinal: Positive for nausea.  Skin: Positive for wound.    Objective:   Vital Signs BP 130/75 (BP Location: Left Arm, Patient Position: Sitting, Cuff Size: Large)   Pulse 71   Temp 98.2 F (36.8 C) (Oral)   SpO2 100%  Vital Signs and Nursing Note Reviewed Chaperone present Physical Exam Constitutional:      General: She is not in acute distress.    Appearance: Normal appearance. She is not ill-appearing or toxic-appearing.  HENT:     Head: Normocephalic and atraumatic.  Eyes:     Pupils: Pupils are equal, round, and reactive to light.  Pulmonary:     Effort: Pulmonary effort is normal.  Chest:       Comments: Partial loss of bilateral NAC noted.  Right NAC with granulation tissue noted medially.  Left NAC with continued sloughing and fibrinous drainage. Left breast tender to palpation, no fluctuance noted, some areas of fat necrosis noted. Abdominal:     General: Abdomen is flat. There is no distension.  Musculoskeletal:     Right lower leg: No edema.     Left lower leg: No edema.  Skin:    General: Skin is warm and dry.  Neurological:     General: No focal deficit present.     Mental Status: She is alert and oriented to person, place, and time.  Psychiatric:        Mood and Affect: Mood normal.  Behavior: Behavior normal.    Assessment/Plan:     ICD-10-CM   1. S/P bilateral breast reduction  Z98.890 Aerobic/Anaerobic Culture (surgical/deep wound)   Ms. Pack is a 49 year old female status post bilateral breast reduction.  She developed fevers, chills, nausea, poor appetite this past Friday on 04/02/2020 and was prescribed doxycycline.  Patient reports that she is feeling somewhat better since starting doxycycline, but has had some nausea and upset stomach/looser stools. She does not have any chest pain, shortness of breath, productive cough.  Vital signs stable today in the office.  Right  breast was aspirated using a sterile technique, no fluid was able to be aspirated.  Patient tolerated this well without any issues.  I did sharply excise some necrotic and sloughing exudate from both left and right breast wounds.  Patient tolerated this without any issues.  Purulent drainage from right breast was cultured.  Discussed with patient that I would discuss her case with Dr. Marla Roe.  At this time she is well-appearing, nontoxic.  I do not feel as if she needs any IV antibiotics at this time, she has had some improvement with p.o. doxycycline.  I discussed with her to keep an eye on redness of left breast and to call if she develops any worsening/new symptoms.  I also discussed with her that I would call her this evening to check in and update her.  I will provide patient with an update once culture results are received and update antibiotic regimen pending cultures.  Call with any questions or concerns Pictures were obtained of the patient and placed in the chart with the patient's or guardian's permission.  Would like patient to follow up this week, either Thursday/friday. Sooner if she has any negative changes.  Carola Rhine Tenna Lacko, PA-C 04/05/2020, 2:47 PM

## 2020-04-06 ENCOUNTER — Encounter: Payer: Self-pay | Admitting: Plastic Surgery

## 2020-04-06 ENCOUNTER — Ambulatory Visit (INDEPENDENT_AMBULATORY_CARE_PROVIDER_SITE_OTHER): Payer: No Typology Code available for payment source | Admitting: Plastic Surgery

## 2020-04-06 VITALS — BP 125/78 | HR 60 | Temp 99.0°F

## 2020-04-06 DIAGNOSIS — Z9889 Other specified postprocedural states: Secondary | ICD-10-CM

## 2020-04-08 ENCOUNTER — Other Ambulatory Visit: Payer: Self-pay | Admitting: Surgical

## 2020-04-08 ENCOUNTER — Telehealth: Payer: Self-pay | Admitting: Surgical

## 2020-04-08 MED ORDER — SULFAMETHOXAZOLE-TRIMETHOPRIM 800-160 MG PO TABS
1.0000 | ORAL_TABLET | Freq: Two times a day (BID) | ORAL | 0 refills | Status: DC
Start: 2020-04-08 — End: 2020-04-09

## 2020-04-08 MED ORDER — SULFAMETHOXAZOLE-TRIMETHOPRIM 800-160 MG PO TABS
1.0000 | ORAL_TABLET | Freq: Two times a day (BID) | ORAL | 0 refills | Status: DC
Start: 2020-04-08 — End: 2020-04-08

## 2020-04-08 NOTE — Telephone Encounter (Signed)
Called patient to discuss wound culture results.  No answer.  We will try to call again later.  Plan to change from doxycycline to Bactrim.

## 2020-04-09 ENCOUNTER — Other Ambulatory Visit: Payer: Self-pay | Admitting: Surgical

## 2020-04-09 LAB — AEROBIC/ANAEROBIC CULTURE W GRAM STAIN (SURGICAL/DEEP WOUND)

## 2020-04-09 MED ORDER — SULFAMETHOXAZOLE-TRIMETHOPRIM 800-160 MG PO TABS: 1.0000 | ORAL_TABLET | Freq: Two times a day (BID) | ORAL | 0 refills | Status: AC

## 2020-04-09 MED FILL — SULFAMETHOXAZOLE-TMP DS TAB: 800-160 | 7 days supply | Qty: 14 | Fill #0

## 2020-04-12 NOTE — Progress Notes (Signed)
Patient is a 49 yr old female here for follow-up after undergoing a bilateral breast reduction with Dr. Marla Roe on 03/03/20.  After surgery she developed small hematomas at the epidermal dermal area of both NACs that developed into wounds. At last visit on 04/06/20 necrotic and sloughing tissue was excised from bilateral breast wounds. A significant amount of drainage was obtained from the right breast by applying pressure. Donated Acell was applied. Cultures returned Klebsiella pneumoniae, E.coli; patient switched to Bactrim.  ~ 6 weeks PO Patient reports she feels she is improving.  Left breast NAC wounds have expanded and joined together.  Patient reports significant drainage from the left breast over the last week.  Right breast wound is of similar size.  Some drainage was able to be expressed from the right breast today. Exudate present. All other incisions are intact, C/D.  Erythema on the left breast has resolved.  Erythema present on right breast inferior portion that is warm to the touch.  Patient denies fever, chills, nausea/vomiting.   Pictures were obtained of the patient and placed in the chart with the patient's or guardian's permission.  Donated Acell was placed on bilateral NAC wounds and covered with Adaptic, K-Y jelly, 4 x 4 gauze, and ABD.  Apply K-Y jelly daily to the Adaptic and cover with new 4 x 4 gauze and ABD.  No showering for 5 days.  May remove dressings including Adaptic prior to showering and then cover wounds with Xeroform, gauze, and ABDs.  Follow-up in 1 week.  Sent in an additional Rx for Bactrim for 7 days.  Recommend taking a probiotic. Call office with any questions or concerns.

## 2020-04-13 ENCOUNTER — Encounter: Payer: Self-pay | Admitting: Plastic Surgery

## 2020-04-13 ENCOUNTER — Ambulatory Visit (INDEPENDENT_AMBULATORY_CARE_PROVIDER_SITE_OTHER): Payer: No Typology Code available for payment source | Admitting: Plastic Surgery

## 2020-04-13 ENCOUNTER — Other Ambulatory Visit: Payer: Self-pay

## 2020-04-13 VITALS — BP 112/65 | HR 63 | Temp 98.2°F

## 2020-04-13 DIAGNOSIS — Z9889 Other specified postprocedural states: Secondary | ICD-10-CM

## 2020-04-13 MED ORDER — SULFAMETHOXAZOLE-TRIMETHOPRIM 800-160 MG PO TABS: 1.0000 | ORAL_TABLET | Freq: Two times a day (BID) | ORAL | 0 refills | Status: AC

## 2020-04-14 MED FILL — SULFAMETHOXAZOLE-TMP DS TAB: 800-160 | 7 days supply | Qty: 14 | Fill #0

## 2020-04-19 NOTE — Progress Notes (Addendum)
Patient is a 49 yr-old female here for follow-up after undergoing a bilateral breast reduction on 03/03/20 with Dr. Marla Roe. After sugery she developed small hematomas at the spidermal dermal area of bilateral NACs that progressed into wounds. Cultures returned Klebsiella pneumoniae, E. Coli; patient placed on Bactrim.  At last visit 04/13/20, donated Acell placed in bilateral wounds. Extended Abx for 7 additional days.  ~ 7 weeks PO Patient reports that shortly after starting the Bactrim antibiotics provided at her last visit she had significant drainage from the right breast wound and the redness resolved.  She reports continued drainage from bilateral wounds.  On examination right breast redness has resolved and NAC wound is improving.  Left breast wound continues to declare itself.  Sharp debridement of necrotic tissue performed today.  Patient denies fever, chills, nausea, vomiting, diarrhea.  Donated ACell powder placed in wound of left breast and wound was packed, covered with Adaptic, K-Y jelly, gauze, ABD.  Donated ACell sheet was placed on right breast wound and covered with Adaptic, K-Y jelly, gauze, ABD.  Continue to apply K-Y jelly daily to Adaptic.  And change remaining dressing as needed.  Patient can remove packing in 2 to 3 days and replace (repeat every other day after initial 2-3 days).  Continue ABX until completed (Friday).  Follow-up next week.  Call office with any questions/concerns.     Pictures were obtained of the patient and placed in the chart with the patient's or guardian's permission.  The [place] was signed into law in 2016 which includes the topic of electronic health records.  This provides immediate access to information in MyChart.  This includes consultation notes, operative notes, office notes, lab results and pathology reports.  If you have any questions about what you read please let us know at your next visit or call us at the office.  We are right  here with you.

## 2020-04-20 ENCOUNTER — Encounter: Payer: Self-pay | Admitting: Plastic Surgery

## 2020-04-20 ENCOUNTER — Other Ambulatory Visit: Payer: Self-pay

## 2020-04-20 ENCOUNTER — Ambulatory Visit (INDEPENDENT_AMBULATORY_CARE_PROVIDER_SITE_OTHER): Payer: No Typology Code available for payment source | Admitting: Plastic Surgery

## 2020-04-20 VITALS — BP 123/83 | HR 88 | Temp 98.4°F

## 2020-04-20 DIAGNOSIS — S21001D Unspecified open wound of right breast, subsequent encounter: Secondary | ICD-10-CM

## 2020-04-20 DIAGNOSIS — S21002D Unspecified open wound of left breast, subsequent encounter: Secondary | ICD-10-CM

## 2020-04-20 DIAGNOSIS — Z9889 Other specified postprocedural states: Secondary | ICD-10-CM

## 2020-04-27 ENCOUNTER — Ambulatory Visit (INDEPENDENT_AMBULATORY_CARE_PROVIDER_SITE_OTHER): Payer: No Typology Code available for payment source

## 2020-04-27 ENCOUNTER — Encounter (HOSPITAL_BASED_OUTPATIENT_CLINIC_OR_DEPARTMENT_OTHER): Payer: Self-pay | Admitting: Plastic Surgery

## 2020-04-27 ENCOUNTER — Other Ambulatory Visit: Payer: Self-pay

## 2020-04-27 ENCOUNTER — Other Ambulatory Visit: Payer: Self-pay | Admitting: Physician Assistant

## 2020-04-27 ENCOUNTER — Telehealth: Payer: Self-pay | Admitting: Physician Assistant

## 2020-04-27 DIAGNOSIS — Z01818 Encounter for other preprocedural examination: Secondary | ICD-10-CM

## 2020-04-27 DIAGNOSIS — J209 Acute bronchitis, unspecified: Secondary | ICD-10-CM

## 2020-04-27 DIAGNOSIS — R059 Cough, unspecified: Secondary | ICD-10-CM

## 2020-04-27 DIAGNOSIS — R05 Cough: Secondary | ICD-10-CM

## 2020-04-27 MED ORDER — PREDNISONE 50 MG PO TABS
50.0000 mg | ORAL_TABLET | Freq: Every day | ORAL | 0 refills | Status: DC
Start: 2020-04-27 — End: 2020-06-03

## 2020-04-27 MED ORDER — ALBUTEROL SULFATE HFA 108 (90 BASE) MCG/ACT IN AERS
2.0000 | INHALATION_SPRAY | RESPIRATORY_TRACT | 0 refills | Status: DC | PRN
Start: 2020-04-27 — End: 2020-06-03

## 2020-04-27 MED FILL — ALBUTEROL SULFATE HFA 108 (: 108 (90 BAS | 17 days supply | Qty: 18 | Fill #0

## 2020-04-27 MED FILL — predniSONE 50 MG TABS: 50 | 5 days supply | Qty: 5 | Fill #0

## 2020-04-27 NOTE — Progress Notes (Signed)
No pneumonia/mass/effusion but you do have some bronchial thickening. This is consistent with bronchitis. Usually would treat with prednisone and albuterol but I don't think prednisone before surgery is a good idea. Lets see what your surgeon thinks.

## 2020-04-27 NOTE — Progress Notes (Signed)
Dr Glennon Mac (Anesthesia) notified of CXR report from today. Since patient is still with cough ,anesthesia does not want to proceed with surgery. Shawna from Dr Dillingham's office was notified

## 2020-04-27 NOTE — Telephone Encounter (Signed)
Surgery next week schedule. Persistent cough. Just finished bactrim. CXR showed bronchial thickening. Sent prednisone burst and albuterol.

## 2020-04-27 NOTE — Progress Notes (Signed)
Cough wants xray before surgery.

## 2020-04-27 NOTE — Progress Notes (Signed)
Pre assessment call completed. Pt states she has had cough for 3 weeks and still present. Completed Bactrim last week for dx of pneumonia. Dr Glennon Mac (Anesthesia) notified. Pt is not candidate for outpatient surgery at this time with persistent cough per Dr Glennon Mac. Dr Eusebio Friendly office notified

## 2020-04-28 ENCOUNTER — Other Ambulatory Visit (HOSPITAL_COMMUNITY): Payer: No Typology Code available for payment source

## 2020-04-28 NOTE — Progress Notes (Signed)
Patient is a 49 year old female here for follow-up after undergoing a bilateral breast reduction on 03/03/2020 with Dr. Marla Roe.  After surgery she developed small hematomas at the epidermal dermal area of bilateral NAC's that progressed into wounds.  Cultures returned Klebsiella pneumonia and E. coli; patient placed on Bactrim.  On 8/17 visit donated ACell was placed in bilateral wounds and antibiotic was extended for 7 additional days.  On 8/24 visit donated ACell was placed in bilateral wounds.  Patient is currently scheduled to go back to the OR for wound closure next week.  ~ 8 weeks PO Patient reports she is feeling better.  She had recent checks x-ray and was diagnosed with bronchitis; placed on prednisone and albuterol.  She completes the prednisone on Sunday.  Bilateral wounds are continuing to improve and fill-in.  Mild erythema present along with drainage.  No signs of infection.  Patient reports some nausea.  Denies fever/chills, vomiting.  Dr. Marla Roe was present for exam today.  Donated ACell placed bilaterally.  May remove dressing and shower in 3 to 5 days.  Then cover wounds with Xeroform and gauze as needed.  Follow-up in 1 week.  We will delay surgery 1 to 2 weeks to allow additional time for the wound to continue to fill in and to allow the prednisone to cycle out of the patient's system to improve overall outcomes.  Patient is in agreement with plan.  Call office with any questions/concerns.  The [place] was signed into law in 2016 which includes the topic of electronic health records.  This provides immediate access to information in MyChart.  This includes consultation notes, operative notes, office notes, lab results and pathology reports.  If you have any questions about what you read please let us know at your next visit or call us at the office.  We are right here with you.

## 2020-04-30 ENCOUNTER — Ambulatory Visit: Payer: No Typology Code available for payment source | Admitting: Plastic Surgery

## 2020-04-30 ENCOUNTER — Ambulatory Visit (INDEPENDENT_AMBULATORY_CARE_PROVIDER_SITE_OTHER): Payer: No Typology Code available for payment source | Admitting: Plastic Surgery

## 2020-04-30 ENCOUNTER — Encounter: Payer: Self-pay | Admitting: Plastic Surgery

## 2020-04-30 ENCOUNTER — Other Ambulatory Visit: Payer: Self-pay

## 2020-04-30 VITALS — HR 73 | Temp 97.8°F

## 2020-04-30 DIAGNOSIS — Z9889 Other specified postprocedural states: Secondary | ICD-10-CM

## 2020-04-30 DIAGNOSIS — S21001D Unspecified open wound of right breast, subsequent encounter: Secondary | ICD-10-CM

## 2020-04-30 DIAGNOSIS — S21002D Unspecified open wound of left breast, subsequent encounter: Secondary | ICD-10-CM

## 2020-05-04 ENCOUNTER — Other Ambulatory Visit (HOSPITAL_COMMUNITY): Payer: No Typology Code available for payment source

## 2020-05-05 ENCOUNTER — Ambulatory Visit (HOSPITAL_BASED_OUTPATIENT_CLINIC_OR_DEPARTMENT_OTHER): Admit: 2020-05-05 | Payer: No Typology Code available for payment source | Admitting: Plastic Surgery

## 2020-05-05 HISTORY — DX: Pneumonia, unspecified organism: J18.9

## 2020-05-05 SURGERY — IRRIGATION AND DEBRIDEMENT WOUND
Anesthesia: General | Site: Breast | Laterality: Bilateral

## 2020-05-07 ENCOUNTER — Encounter: Payer: No Typology Code available for payment source | Admitting: Plastic Surgery

## 2020-05-10 NOTE — Progress Notes (Signed)
Patient is a 49 yr-old female here for follow-up after undergoing a bilateral breast reduction on 03/03/20 with Dr. Marla Roe. After surgery she developed small hematomas at the epidermal dermal areas of bilater NACs that progressed into wounds. Cultures returned Klebsiella pneumonia and E. Coli; patient treated with Bractrim. Donated Acell placed 8/17, 8/24, and 9/3. Patient ws diagnosed with bronchitis in early September and placed on prednisone. Wound closer delayed 2 weeks to allow time for wounds to continue to fill in and allow prednisone to cycle out of patient's system.  ~ 10 weeks PO Patient's bilateral breast wounds have continued to fill in and heal.  No signs of infection, redness, seroma/hematoma.  Bilateral drainage is present.  Patient denies pain or tenderness of the area.  Patient denies fever/chills, nausea/vomiting.  Reports she completed the prednisone last Sunday.  Do daily dressing changes of endoform or Vaseline and gauze.  Follow-up in 2 weeks.  Call office with any questions/concerns.        Pictures were obtained of the patient and placed in the chart with the patient's or guardian's permission.

## 2020-05-11 ENCOUNTER — Encounter: Payer: Self-pay | Admitting: Plastic Surgery

## 2020-05-11 ENCOUNTER — Ambulatory Visit (INDEPENDENT_AMBULATORY_CARE_PROVIDER_SITE_OTHER): Payer: No Typology Code available for payment source | Admitting: Plastic Surgery

## 2020-05-11 ENCOUNTER — Other Ambulatory Visit: Payer: Self-pay

## 2020-05-11 ENCOUNTER — Telehealth: Payer: Self-pay

## 2020-05-11 VITALS — BP 139/87 | HR 70 | Temp 97.5°F

## 2020-05-11 DIAGNOSIS — S21002D Unspecified open wound of left breast, subsequent encounter: Secondary | ICD-10-CM

## 2020-05-11 DIAGNOSIS — Z9889 Other specified postprocedural states: Secondary | ICD-10-CM

## 2020-05-11 DIAGNOSIS — S21001D Unspecified open wound of right breast, subsequent encounter: Secondary | ICD-10-CM

## 2020-05-11 NOTE — Telephone Encounter (Signed)
Faxed order to [facility]: ABD pads daily and xeroform daily.

## 2020-05-14 ENCOUNTER — Encounter: Payer: No Typology Code available for payment source | Admitting: Plastic Surgery

## 2020-05-31 NOTE — Progress Notes (Signed)
Patient is a 49 year old female here for follow-up after undergoing a bilateral breast reduction on 03/03/2020 with Dr. Marla Roe.  After surgery she developed small hematomas at the epidermal dermal areas of bilateral NAC's that progressed into wounds.    Patient was scheduled for wound closure due to the wounds, however was diagnosed with bronchitis and surgery was delayed.  She is here today for follow-up, approximately 13 weeks postop.  She has been applying Xeroform daily to bilateral breast wounds.  She reports today that she is overall doing well  Chaperone present on exam On exam right breast wounds have healed, she does have a pinhole wound along the vertical limb.  This wound is approximately midline between the NAC and inframammary fold, I was able to express some yellowish drainage from the wound today, there is no surrounding erythema or cellulitic changes noted.  Is nontender to palpation.  Some fat necrosis palpated on exam  Left breast wound has significantly improved, wound is approximately 1-1/2 x 1 cm without any surrounding erythema.  She did form a crevice/indentation during healing process along the left NAC that extends inferiorly.  No erythema or foul odors.  Some fat necrosis palpated on exam  Recommend she continue with Vaseline and gauze or Xeroform dressing changes daily to left breast and right breast.  Patient previously was scheduled for excision of wounds of bilateral breasts, however this was postponed due to patient having bronchitis and being on steroids.  Since then she has made some great progress in healing and may be able to avoid surgery at this time depending on the final contour of bilateral breasts - we discussed this during the visit. she is not having any infectious symptoms, no infectious signs on exam.  Recommend calling with any questions or concerns or if she develops any changes in her symptoms.  I discussed with the patient that I would notify Dr.  Marla Roe of her progress and update her.  Would like to see her back in 3 weeks for reevaluation.  Pictures were obtained of the patient and placed in the chart with the patient's or guardian's permission.

## 2020-06-03 ENCOUNTER — Other Ambulatory Visit: Payer: Self-pay

## 2020-06-03 ENCOUNTER — Ambulatory Visit (INDEPENDENT_AMBULATORY_CARE_PROVIDER_SITE_OTHER): Payer: No Typology Code available for payment source | Admitting: Surgical

## 2020-06-03 ENCOUNTER — Encounter: Payer: Self-pay | Admitting: Plastic Surgery

## 2020-06-03 VITALS — BP 121/69 | HR 67 | Temp 98.0°F

## 2020-06-03 DIAGNOSIS — S21001D Unspecified open wound of right breast, subsequent encounter: Secondary | ICD-10-CM | POA: Diagnosis not present

## 2020-06-03 DIAGNOSIS — Z9889 Other specified postprocedural states: Secondary | ICD-10-CM

## 2020-06-03 DIAGNOSIS — S21002D Unspecified open wound of left breast, subsequent encounter: Secondary | ICD-10-CM | POA: Diagnosis not present

## 2020-06-08 ENCOUNTER — Ambulatory Visit (INDEPENDENT_AMBULATORY_CARE_PROVIDER_SITE_OTHER): Payer: No Typology Code available for payment source | Admitting: Family Medicine

## 2020-06-08 ENCOUNTER — Other Ambulatory Visit: Payer: Self-pay

## 2020-06-08 DIAGNOSIS — L259 Unspecified contact dermatitis, unspecified cause: Secondary | ICD-10-CM | POA: Diagnosis not present

## 2020-06-08 MED ORDER — TRIAMCINOLONE ACETONIDE 0.1 % EX CREA
1.0000 | TOPICAL_CREAM | Freq: Two times a day (BID) | CUTANEOUS | 1 refills | Status: DC
Start: 2020-06-08 — End: 2021-09-01

## 2020-06-08 MED ORDER — METHYLPREDNISOLONE SODIUM SUCC 125 MG IJ SOLR
125.0000 mg | Freq: Once | INTRAMUSCULAR | Status: AC
  Administered 2020-06-08: 125 mg via INTRAMUSCULAR

## 2020-06-08 MED FILL — TRIAMCINOLONE 0.1% CREAM: 0.1 | 15 days supply | Qty: 30 | Fill #0

## 2020-06-08 NOTE — Progress Notes (Signed)
Agree with the note below.  Patient with significant erythematous rash on the left side of her neck and some spotty erythema over the mid chest and left forearm.  Most consistent with contact dermatitis from grass.  Will treat with Solu-Medrol 125 mg IM for acute relief with itching.  We will send over topical steroid cream as the over-the-counter has not been effective.

## 2020-06-08 NOTE — Progress Notes (Signed)
Established Patient Office Visit  Subjective:  Patient ID: Regina Mcconnell, female    DOB: July 19, 1971  Age: 49 y.o. MRN: [id number]  CC:  Chief Complaint  Patient presents with  . Skin Problem    HPI [place] presents for redness and swelling on the left side of her neck. She did mow her yard and believes the swelling and redness is due to an allergic reaction.   Past Medical History:  Diagnosis Date  . Abnormal Pap smear of cervix 1995   --hx colposcopy w/bx and cryotherapy of cervix(paps normal since)  . Abnormal uterine bleeding    reason for ablation in 2009  . Heart murmur   . Pneumonia    03/2020   . PONV (postoperative nausea and vomiting)   . Urinary incontinence    especially with exercise    Past Surgical History:  Procedure Laterality Date  . BREAST REDUCTION SURGERY Bilateral 03/03/2020   Procedure: BREAST REDUCTION WITH LIPOSUCTION;  Surgeon: Wallace Going, DO;  Location: [facility];  Service: Plastics;  Laterality: Bilateral;  . DILATION AND CURETTAGE OF UTERUS     due to heavy cycles  . ENDOMETRIAL ABLATION  2009   Dr. Quincy Simmonds    Family History  Problem Relation Age of Onset  . Diabetes Mother   . Thyroid disease Mother   . Heart disease Father   . Heart disease Paternal Uncle        all deceased from heart attacks  . Stroke Paternal Grandmother   . Breast cancer Neg Hx     Social History   Socioeconomic History  . Marital status: Married    Spouse name: Francee Piccolo  . Number of children: 1  . Years of education: Assoc degr  . Highest education level: Not on file  Occupational History  . Occupation: Counsellor: [employer]  Tobacco Use  . Smoking status: Never Smoker  . Smokeless tobacco: Never Used  Substance and Sexual Activity  . Alcohol use: Yes    Alcohol/week: 0.0 standard drinks    Comment: once or twice a year  . Drug use: No  . Sexual activity: Yes    Partners: Male    Birth  control/protection: None  Other Topics Concern  . Not on file  Social History Narrative   Working out 1 hour a day.  1-2 caffeine drinks per day.     Social Determinants of Health   Financial Resource Strain:   . Difficulty of Paying Living Expenses: Not on file  Food Insecurity:   . Worried About Charity fundraiser in the Last Year: Not on file  . Ran Out of Food in the Last Year: Not on file  Transportation Needs:   . Lack of Transportation (Medical): Not on file  . Lack of Transportation (Non-Medical): Not on file  Physical Activity:   . Days of Exercise per Week: Not on file  . Minutes of Exercise per Session: Not on file  Stress:   . Feeling of Stress : Not on file  Social Connections:   . Frequency of Communication with Friends and Family: Not on file  . Frequency of Social Gatherings with Friends and Family: Not on file  . Attends Religious Services: Not on file  . Active Member of Clubs or Organizations: Not on file  . Attends Archivist Meetings: Not on file  . Marital Status: Not on file  Intimate Partner Violence:   .  Fear of Current or Ex-Partner: Not on file  . Emotionally Abused: Not on file  . Physically Abused: Not on file  . Sexually Abused: Not on file    Outpatient Medications Prior to Visit  Medication Sig Dispense Refill  . triamcinolone cream (KENALOG) 0.1 % Apply 1 application topically 2 (two) times daily. 80 g 0  . valACYclovir (VALTREX) 1000 MG tablet Take 2 tablets (2,000 mg total) by mouth 2 (two) times daily. For one day for outbreaks. 12 tablet 5   No facility-administered medications prior to visit.    No Known Allergies  ROS Review of Systems    Objective:    Physical Exam  There were no vitals taken for this visit. Wt Readings from Last 3 Encounters:  03/03/20 244 lb 7.8 oz (110.9 kg)  02/12/20 238 lb (108 kg)  12/23/19 246 lb 9.6 oz (111.9 kg)     Health Maintenance Due  Topic Date Due  . COVID-19 Vaccine (1)  Never done  . HIV Screening  Never done  . PAP SMEAR-Modifier  08/05/2018  . INFLUENZA VACCINE  03/28/2020  . MAMMOGRAM  05/13/2020    There are no preventive care reminders to display for this patient.  Lab Results  Component Value Date   TSH 3.35 04/30/2019   Lab Results  Component Value Date   WBC 6.2 04/30/2019   HGB 13.9 04/30/2019   HCT 41.6 04/30/2019   MCV 88.9 04/30/2019   PLT 269 04/30/2019   Lab Results  Component Value Date   NA 139 04/30/2019   K 4.4 04/30/2019   CO2 24 04/30/2019   GLUCOSE 102 (H) 04/30/2019   BUN 21 04/30/2019   CREATININE 0.90 04/30/2019   BILITOT 0.5 04/30/2019   ALKPHOS 79 07/23/2012   AST 15 04/30/2019   ALT 15 04/30/2019   PROT 7.1 04/30/2019   ALBUMIN 4.6 07/23/2012   CALCIUM 9.7 04/30/2019   Lab Results  Component Value Date   CHOL 256 (H) 04/30/2019   Lab Results  Component Value Date   HDL 55 04/30/2019   Lab Results  Component Value Date   LDLCALC 182 (H) 04/30/2019   Lab Results  Component Value Date   TRIG 82 04/30/2019   Lab Results  Component Value Date   CHOLHDL 4.7 04/30/2019   Lab Results  Component Value Date   HGBA1C 5.7 (H) 04/30/2019      Assessment & Plan:  Contact dermatitis - Solu-Medrol 125 mg given, per Dr Madilyn Fireman. Patient tolerated injection well without complications. Patient advised to also pick up cream from pharmacy.   Problem List Items Addressed This Visit    None    Visit Diagnoses    Contact dermatitis, unspecified contact dermatitis type, unspecified trigger    -  Primary   Relevant Medications   methylPREDNISolone sodium succinate (SOLU-MEDROL) 125 mg/2 mL injection 125 mg (Completed) (Start on 06/08/2020  1:45 PM)      Meds ordered this encounter  Medications  . methylPREDNISolone sodium succinate (SOLU-MEDROL) 125 mg/2 mL injection 125 mg    Follow-up: Return if symptoms worsen or fail to improve.    Lavell Luster, [place]

## 2020-06-22 ENCOUNTER — Ambulatory Visit (INDEPENDENT_AMBULATORY_CARE_PROVIDER_SITE_OTHER): Payer: No Typology Code available for payment source | Admitting: Surgical

## 2020-06-22 ENCOUNTER — Other Ambulatory Visit: Payer: Self-pay

## 2020-06-22 ENCOUNTER — Encounter: Payer: Self-pay | Admitting: Surgical

## 2020-06-22 VITALS — BP 144/87 | HR 61 | Temp 98.0°F

## 2020-06-22 DIAGNOSIS — S21002D Unspecified open wound of left breast, subsequent encounter: Secondary | ICD-10-CM | POA: Diagnosis not present

## 2020-06-22 DIAGNOSIS — Z9889 Other specified postprocedural states: Secondary | ICD-10-CM | POA: Diagnosis not present

## 2020-06-22 DIAGNOSIS — S21001D Unspecified open wound of right breast, subsequent encounter: Secondary | ICD-10-CM

## 2020-06-22 NOTE — Progress Notes (Signed)
   Subjective:     Patient ID: Regina Mcconnell, female    DOB: Oct 03, 1970, 49 y.o.   MRN: [id number]  Chief Complaint  Patient presents with  . Follow-up    HPI: The patient is a 49 y.o. female here for follow-up after bilateral breast reduction with liposuction on 03/03/2020 with Dr. Marla Roe.  Postoperatively patient developed some wounds and has been managing these with wound care. She is here today for reevaluation  Patient reports she is doing well, all of the wounds have healed on the left and right breast with the exception of a pinhole wound at the 12:00 mark on the right NAC.  She is otherwise doing well and is pleased with how things are doing thus far.  Review of Systems  Constitutional: Negative.   Respiratory: Negative.   Skin: Positive for wound. Negative for color change and rash.     Objective:   Vital Signs BP (!) 144/87 (BP Location: Left Arm, Patient Position: Sitting, Cuff Size: Large)   Pulse 61   Temp 98 F (36.7 C) (Oral)   SpO2 100%  Vital Signs and Nursing Note Reviewed Chaperone present Physical Exam Constitutional:      General: She is not in acute distress.    Appearance: Normal appearance. She is not ill-appearing.  Chest:    Neurological:     General: No focal deficit present.     Mental Status: She is alert and oriented to person, place, and time.  Psychiatric:        Mood and Affect: Mood normal.        Behavior: Behavior normal.      Assessment/Plan:     ICD-10-CM   1. S/P bilateral breast reduction  Z98.890   2. Open breast wound, left, subsequent encounter  S21.002D   3. Open breast wound, right, subsequent encounter  S21.001D     Patient is overall pleased with the way things have healed, at this time she does not want to pursue any further surgical intervention for her bilateral breasts.  We are going to plan a follow-up in 4 to 6 months to reevaluate and further discuss.  She knows to call if she has any questions or concerns  or changes in the meantime.  She is going to continue to apply Vaseline to the right breast pinhole wound until it has epithelialized.  There is no wounds on the left breast.  There is no sign of any infection in either breast.  Pictures were obtained of the patient and placed in the chart with the patient's or guardian's permission.    Carola Rhine Marjoria Mancillas, PA-C 06/22/2020, 3:57 PM

## 2021-02-09 ENCOUNTER — Telehealth: Payer: Self-pay | Admitting: Family Medicine

## 2021-02-09 NOTE — Telephone Encounter (Signed)
Call pt: Needs pap and mammo.  Is she going to schedule with Dr. Sunday Spillers this summer?

## 2021-02-10 NOTE — Telephone Encounter (Signed)
Spoke with pt.  She has to wait until after July for her mammo since she had breast surgery and she is switching to another GYN that is out on maternity leave right now.  She will schedule pap when provider is back in office.

## 2021-02-17 ENCOUNTER — Other Ambulatory Visit: Payer: Self-pay | Admitting: Physician Assistant

## 2021-02-17 DIAGNOSIS — Z1231 Encounter for screening mammogram for malignant neoplasm of breast: Secondary | ICD-10-CM

## 2021-02-18 ENCOUNTER — Ambulatory Visit
Admission: RE | Admit: 2021-02-18 | Discharge: 2021-02-18 | Disposition: A | Discharge status: Home / Self Care | Payer: No Typology Code available for payment source | Source: Ambulatory Visit | Attending: Physician Assistant | Admitting: Physician Assistant

## 2021-02-18 ENCOUNTER — Other Ambulatory Visit: Payer: Self-pay

## 2021-02-18 DIAGNOSIS — Z1231 Encounter for screening mammogram for malignant neoplasm of breast: Secondary | ICD-10-CM

## 2021-02-21 NOTE — Progress Notes (Signed)
Normal mammogram. Follow up in 1 year.

## 2021-02-23 ENCOUNTER — Ambulatory Visit (INDEPENDENT_AMBULATORY_CARE_PROVIDER_SITE_OTHER): Payer: No Typology Code available for payment source | Admitting: Physician Assistant

## 2021-02-23 ENCOUNTER — Encounter: Payer: Self-pay | Admitting: Physician Assistant

## 2021-02-23 ENCOUNTER — Other Ambulatory Visit (HOSPITAL_BASED_OUTPATIENT_CLINIC_OR_DEPARTMENT_OTHER): Payer: Self-pay

## 2021-02-23 ENCOUNTER — Other Ambulatory Visit: Payer: Self-pay

## 2021-02-23 VITALS — BP 129/75 | HR 68 | Wt 232.0 lb

## 2021-02-23 DIAGNOSIS — E6609 Other obesity due to excess calories: Secondary | ICD-10-CM

## 2021-02-23 DIAGNOSIS — E559 Vitamin D deficiency, unspecified: Secondary | ICD-10-CM | POA: Diagnosis not present

## 2021-02-23 DIAGNOSIS — Z1211 Encounter for screening for malignant neoplasm of colon: Secondary | ICD-10-CM

## 2021-02-23 DIAGNOSIS — Z0000 Encounter for general adult medical examination without abnormal findings: Secondary | ICD-10-CM | POA: Diagnosis not present

## 2021-02-23 DIAGNOSIS — Z6838 Body mass index (BMI) 38.0-38.9, adult: Secondary | ICD-10-CM

## 2021-02-23 LAB — COMPLETE METABOLIC PANEL WITH GFR
AG Ratio: 1.8 (calc) (ref 1.0–2.5)
ALT: 17 U/L (ref 6–29)
AST: 15 U/L (ref 10–35)
Albumin: 4.5 g/dL (ref 3.6–5.1)
Alkaline phosphatase (APISO): 83 U/L (ref 31–125)
BUN: 17 mg/dL (ref 7–25)
CO2: 29 mmol/L (ref 20–32)
Calcium: 9.5 mg/dL (ref 8.6–10.2)
Chloride: 105 mmol/L (ref 98–110)
Creat: 0.9 mg/dL (ref 0.50–1.10)
GFR, Est African American: 87 mL/min/{1.73_m2} (ref >=60)
GFR, Est Non African American: 75 mL/min/{1.73_m2} (ref >=60)
Globulin: 2.5 g/dL (calc) (ref 1.9–3.7)
Glucose, Bld: 84 mg/dL (ref 65–99)
Potassium: 4.1 mmol/L (ref 3.5–5.3)
Sodium: 142 mmol/L (ref 135–146)
Total Bilirubin: 0.5 mg/dL (ref 0.2–1.2)
Total Protein: 7 g/dL (ref 6.1–8.1)

## 2021-02-23 LAB — CBC WITH DIFFERENTIAL/PLATELET
Absolute Monocytes: 441 cells/uL (ref 200–950)
Basophils Absolute: 49 cells/uL (ref 0–200)
Basophils Relative: 0.7 %
Eosinophils Absolute: 119 cells/uL (ref 15–500)
Eosinophils Relative: 1.7 %
HCT: 40.5 % (ref 35.0–45.0)
Hemoglobin: 13.6 g/dL (ref 11.7–15.5)
Lymphs Abs: 2772 cells/uL (ref 850–3900)
MCH: 29.4 pg (ref 27.0–33.0)
MCHC: 33.6 g/dL (ref 32.0–36.0)
MCV: 87.5 fL (ref 80.0–100.0)
MPV: 11 fL (ref 7.5–12.5)
Monocytes Relative: 6.3 %
Neutro Abs: 3619 cells/uL (ref 1500–7800)
Neutrophils Relative %: 51.7 %
Platelets: 252 10*3/uL (ref 140–400)
RBC: 4.63 10*6/uL (ref 3.80–5.10)
RDW: 12.4 % (ref 11.0–15.0)
Total Lymphocyte: 39.6 %
WBC: 7 10*3/uL (ref 3.8–10.8)

## 2021-02-23 LAB — IRON,TIBC AND FERRITIN PANEL
%SAT: 21 % (calc) (ref 16–45)
Ferritin: 50 ng/mL (ref 16–232)
Iron: 82 ug/dL (ref 40–190)
TIBC: 394 mcg/dL (calc) (ref 250–450)

## 2021-02-23 LAB — LIPID PANEL W/REFLEX DIRECT LDL
Cholesterol: 243 mg/dL — ABNORMAL HIGH (ref <200)
HDL: 56 mg/dL (ref >=50)
LDL Cholesterol (Calc): 161 mg/dL (calc) — ABNORMAL HIGH
Non-HDL Cholesterol (Calc): 187 mg/dL (calc) — ABNORMAL HIGH (ref <130)
Total CHOL/HDL Ratio: 4.3 (calc) (ref <5.0)
Triglycerides: 132 mg/dL (ref <150)

## 2021-02-23 LAB — B12 AND FOLATE PANEL
Folate: 16.6 ng/mL
Vitamin B-12: 267 pg/mL (ref 200–1100)

## 2021-02-23 LAB — VITAMIN D 25 HYDROXY (VIT D DEFICIENCY, FRACTURES): Vit D, 25-Hydroxy: 30 ng/mL (ref 30–100)

## 2021-02-23 LAB — TSH: TSH: 2.35 mIU/L

## 2021-02-23 MED ORDER — WEGOVY 0.25 MG/0.5ML ~~LOC~~ SOAJ
0.2500 mg | SUBCUTANEOUS | 0 refills | Status: DC
  Filled 2021-02-23: qty 2, 28d supply, fill #0
  Filled 2021-03-03 – 2021-03-08 (×2): qty 2, 30d supply, fill #0
  Filled 2021-03-09: qty 2, 28d supply, fill #0

## 2021-02-23 NOTE — Patient Instructions (Signed)
Health Maintenance, Female Adopting a healthy lifestyle and getting preventive care are important in promoting health and wellness. Ask your health care provider about: The right schedule for you to have regular tests and exams. Things you can do on your own to prevent diseases and keep yourself healthy. What should I know about diet, weight, and exercise? Eat a healthy diet  Eat a diet that includes plenty of vegetables, fruits, low-fat dairy products, and lean protein. Do not eat a lot of foods that are high in solid fats, added sugars, or sodium.  Maintain a healthy weight Body mass index (BMI) is used to identify weight problems. It estimates body fat based on height and weight. Your health care provider can help determineyour BMI and help you achieve or maintain a healthy weight. Get regular exercise Get regular exercise. This is one of the most important things you can do for your health. Most adults should: Exercise for at least 150 minutes each week. The exercise should increase your heart rate and make you sweat (moderate-intensity exercise). Do strengthening exercises at least twice a week. This is in addition to the moderate-intensity exercise. Spend less time sitting. Even light physical activity can be beneficial. Watch cholesterol and blood lipids Have your blood tested for lipids and cholesterol at 50 years of age, then havethis test every 5 years. Have your cholesterol levels checked more often if: Your lipid or cholesterol levels are high. You are older than 50 years of age. You are at high risk for heart disease. What should I know about cancer screening? Depending on your health history and family history, you may need to have cancer screening at various ages. This may include screening for: Breast cancer. Cervical cancer. Colorectal cancer. Skin cancer. Lung cancer. What should I know about heart disease, diabetes, and high blood pressure? Blood pressure and heart  disease High blood pressure causes heart disease and increases the risk of stroke. This is more likely to develop in people who have high blood pressure readings, are of African descent, or are overweight. Have your blood pressure checked: Every 3-5 years if you are 18-39 years of age. Every year if you are 40 years old or older. Diabetes Have regular diabetes screenings. This checks your fasting blood sugar level. Have the screening done: Once every three years after age 40 if you are at a normal weight and have a low risk for diabetes. More often and at a younger age if you are overweight or have a high risk for diabetes. What should I know about preventing infection? Hepatitis B If you have a higher risk for hepatitis B, you should be screened for this virus. Talk with your health care provider to find out if you are at risk forhepatitis B infection. Hepatitis C Testing is recommended for: Everyone born from 1945 through 1965. Anyone with known risk factors for hepatitis C. Sexually transmitted infections (STIs) Get screened for STIs, including gonorrhea and chlamydia, if: You are sexually active and are younger than 50 years of age. You are older than 50 years of age and your health care provider tells you that you are at risk for this type of infection. Your sexual activity has changed since you were last screened, and you are at increased risk for chlamydia or gonorrhea. Ask your health care provider if you are at risk. Ask your health care provider about whether you are at high risk for HIV. Your health care provider may recommend a prescription medicine to help   prevent HIV infection. If you choose to take medicine to prevent HIV, you should first get tested for HIV. You should then be tested every 3 months for as long as you are taking the medicine. Pregnancy If you are about to stop having your period (premenopausal) and you may become pregnant, seek counseling before you get  pregnant. Take 400 to 800 micrograms (mcg) of folic acid every day if you become pregnant. Ask for birth control (contraception) if you want to prevent pregnancy. Osteoporosis and menopause Osteoporosis is a disease in which the bones lose minerals and strength with aging. This can result in bone fractures. If you are 65 years old or older, or if you are at risk for osteoporosis and fractures, ask your health care provider if you should: Be screened for bone loss. Take a calcium or vitamin D supplement to lower your risk of fractures. Be given hormone replacement therapy (HRT) to treat symptoms of menopause. Follow these instructions at home: Lifestyle Do not use any products that contain nicotine or tobacco, such as cigarettes, e-cigarettes, and chewing tobacco. If you need help quitting, ask your health care provider. Do not use street drugs. Do not share needles. Ask your health care provider for help if you need support or information about quitting drugs. Alcohol use Do not drink alcohol if: Your health care provider tells you not to drink. You are pregnant, may be pregnant, or are planning to become pregnant. If you drink alcohol: Limit how much you use to 0-1 drink a day. Limit intake if you are breastfeeding. Be aware of how much alcohol is in your drink. In the [place] one drink equals one 12 oz bottle of beer (355 mL), one 5 oz glass of wine (148 mL), or one 1 oz glass of hard liquor (44 mL). General instructions Schedule regular health, dental, and eye exams. Stay current with your vaccines. Tell your health care provider if: You often feel depressed. You have ever been abused or do not feel safe at home. Summary Adopting a healthy lifestyle and getting preventive care are important in promoting health and wellness. Follow your health care provider's instructions about healthy diet, exercising, and getting tested or screened for diseases. Follow your health care provider's  instructions on monitoring your cholesterol and blood pressure. This information is not intended to replace advice given to you by your health care provider. Make sure you discuss any questions you have with your healthcare provider. Document Revised: 08/07/2018 Document Reviewed: 08/07/2018 Elsevier Patient Education  2022 Elsevier Inc.  

## 2021-02-23 NOTE — Progress Notes (Signed)
Subjective:     Regina Mcconnell is a 50 y.o. female and is here for a comprehensive physical exam. The patient reports problems - she does want to discuss weight management medications. She has done optivia and started losing weight but just not able to afford and does not like the foods .she is walking a few times a week. Pt has breast reduction done earlier this year. She has no concerns or complaints.   Social History   Socioeconomic History   Marital status: Married    Spouse name: Francee Piccolo   Number of children: 1   Years of education: Assoc degr   Highest education level: Not on file  Occupational History   Occupation: Counsellor: [employer]  Tobacco Use   Smoking status: Never   Smokeless tobacco: Never  Substance and Sexual Activity   Alcohol use: Yes    Alcohol/week: 0.0 standard drinks    Comment: once or twice a year   Drug use: No   Sexual activity: Yes    Partners: Male    Birth control/protection: None  Other Topics Concern   Not on file  Social History Narrative   Working out 1 hour a day.  1-2 caffeine drinks per day.     Social Determinants of Health   Financial Resource Strain: Not on file  Food Insecurity: Not on file  Transportation Needs: Not on file  Physical Activity: Not on file  Stress: Not on file  Social Connections: Not on file  Intimate Partner Violence: Not on file   Health Maintenance  Topic Date Due   COVID-19 Vaccine (1) Never done   HIV Screening  Never done   COLONOSCOPY (Pts 45-32yrs Insurance coverage will need to be confirmed)  Never done   PAP SMEAR-Modifier  08/05/2020   INFLUENZA VACCINE  03/28/2021   TETANUS/TDAP  02/08/2022   MAMMOGRAM  02/18/2022   Hepatitis C Screening  Completed   Pneumococcal Vaccine 27-30 Years old  Aged Out   HPV VACCINES  Aged Out    The following portions of the patient's history were reviewed and updated as appropriate: allergies, current medications, past family history, past  medical history, past social history, past surgical history, and problem list.  Review of Systems Pertinent items are noted in HPI.   Objective:    BP 129/75   Pulse 68   Wt 232 lb (105.2 kg)   SpO2 100%   BMI 38.61 kg/m  General appearance: alert, cooperative, appears stated age, and moderately obese Head: Normocephalic, without obvious abnormality, atraumatic Eyes: conjunctivae/corneas clear. PERRL, EOM's intact. Fundi benign. Ears: normal TM's and external ear canals both ears Nose: Nares normal. Septum midline. Mucosa normal. No drainage or sinus tenderness. Throat: lips, mucosa, and tongue normal; teeth and gums normal Neck: no adenopathy, no carotid bruit, no JVD, supple, symmetrical, trachea midline, and thyroid not enlarged, symmetric, no tenderness/mass/nodules Back: symmetric, no curvature. ROM normal. No CVA tenderness. Lungs: clear to auscultation bilaterally Heart: regular rate and rhythm, S1, S2 normal, no murmur, click, rub or gallop Abdomen: soft, non-tender; bowel sounds normal; no masses,  no organomegaly Extremities: extremities normal, atraumatic, no cyanosis or edema Pulses: 2+ and symmetric Skin: Skin color, texture, turgor normal. No rashes or lesions Lymph nodes: Cervical, supraclavicular, and axillary nodes normal. Neurologic: Grossly normal   .. Depression screen [facility] 2/9 02/23/2021 04/30/2019 02/05/2018  Decreased Interest 0 0 0  Down, Depressed, Hopeless 0 0 0  PHQ - 2  Score 0 0 0  Altered sleeping - 0 -  Tired, decreased energy - 0 -  Change in appetite - 0 -  Feeling bad or failure about yourself  - 0 -  Trouble concentrating - 0 -  Moving slowly or fidgety/restless - 0 -  Suicidal thoughts - 0 -  PHQ-9 Score - 0 -  Difficult doing work/chores - Not difficult at all -    Assessment:    Healthy female exam.      Plan:    Marland KitchenMarland KitchenEquilla was seen today for annual exam.  Diagnoses and all orders for this visit:  Routine general medical examination  at a health care facility -     COMPLETE METABOLIC PANEL WITH GFR -     TSH -     Lipid Panel w/reflex Direct LDL -     Vitamin D (25 hydroxy) -     B12 and Folate Panel -     CBC with Differential/Platelet -     Fe+TIBC+Fer -     Ambulatory referral to Gastroenterology  Class 2 obesity due to excess calories without serious comorbidity with body mass index (BMI) of 38.0 to 38.9 in adult -     Semaglutide-Weight Management (WEGOVY) 0.25 MG/0.5ML SOAJ; Inject 0.25 mg into the skin once a week.  Vitamin D insufficiency  Colon cancer screening -     Ambulatory referral to Gastroenterology  .Marland Kitchen Discussed 150 minutes of exercise a week.  Encouraged vitamin D 1000 units and Calcium 1300mg  or 4 servings of dairy a day.  PHQ no concerns.  Fasting labs ordered.  Mammogram UTD.  Pap will be done with GYN.  Colonoscopy ordered for screening.  Covid vaccine UTD.  After 50 consider shingrix.   Marland Kitchen.Discussed low carb diet with 1500 calories and 80g of protein.  Exercising at least 150 minutes a week.  [employer] Fitness Pal could be a Microbiologist.  Discussed options.  Start wegovy .25mg  once weekly. Titrate up monthly until 2.4mg .  Discussed side effects.  Follow up in 3 months.   See After Visit Summary for Counseling Recommendations

## 2021-02-24 ENCOUNTER — Encounter: Payer: Self-pay | Admitting: Physician Assistant

## 2021-02-24 DIAGNOSIS — E7800 Pure hypercholesterolemia, unspecified: Secondary | ICD-10-CM | POA: Insufficient documentation

## 2021-02-24 NOTE — Progress Notes (Signed)
Viktoriya,   Thyroid looks great.  Kidney, liver, glucose looks great.  Vitamin D better than one year ago but I would take 2000 units daily.  Hemoglobin great.  Vitamin B12 low. Start 1036mcg daily and consider a b12 shot in office to get you started.  Cholesterol better than 1 year ago. HDL look good. LDL better but still not to goal.  You are making some diet and health changes lets see what levels look like in 6 months to 1 year.

## 2021-03-03 ENCOUNTER — Other Ambulatory Visit (HOSPITAL_BASED_OUTPATIENT_CLINIC_OR_DEPARTMENT_OTHER): Payer: Self-pay

## 2021-03-08 ENCOUNTER — Other Ambulatory Visit (HOSPITAL_BASED_OUTPATIENT_CLINIC_OR_DEPARTMENT_OTHER): Payer: Self-pay

## 2021-03-09 ENCOUNTER — Other Ambulatory Visit: Payer: Self-pay | Admitting: Physician Assistant

## 2021-03-09 ENCOUNTER — Other Ambulatory Visit (HOSPITAL_BASED_OUTPATIENT_CLINIC_OR_DEPARTMENT_OTHER): Payer: Self-pay

## 2021-03-09 ENCOUNTER — Other Ambulatory Visit (HOSPITAL_COMMUNITY): Payer: Self-pay

## 2021-03-09 ENCOUNTER — Telehealth: Payer: Self-pay

## 2021-03-09 DIAGNOSIS — E8881 Metabolic syndrome: Secondary | ICD-10-CM

## 2021-03-09 MED ORDER — OZEMPIC (0.25 OR 0.5 MG/DOSE) 2 MG/1.5ML ~~LOC~~ SOPN
0.5000 mg | PEN_INJECTOR | SUBCUTANEOUS | 1 refills | Status: DC
  Filled 2021-03-09: qty 1.5, 28d supply, fill #0
  Filled 2021-06-07: qty 1.5, 28d supply, fill #1

## 2021-03-09 NOTE — Telephone Encounter (Signed)
Fax received from pharmacy indicating the need for a PA on Wegovy 0.25mg /0.67ml. Completed attached form and returned via fax, confirmation page obtained.

## 2021-03-11 ENCOUNTER — Other Ambulatory Visit (HOSPITAL_BASED_OUTPATIENT_CLINIC_OR_DEPARTMENT_OTHER): Payer: Self-pay

## 2021-03-14 NOTE — Telephone Encounter (Signed)
PA denied. "Based on the information submitted for review, you did not meet our guideline rules for the requested drug"

## 2021-03-28 DIAGNOSIS — Z8616 Personal history of COVID-19: Secondary | ICD-10-CM

## 2021-03-28 HISTORY — DX: Personal history of COVID-19: Z86.16

## 2021-06-02 NOTE — Progress Notes (Signed)
Regina Mcconnell  Referring Provider: Lavada Mesi PCP: Lavada Mesi Date of Service: 06/03/2021  SUBJECTIVE Chief Complaint: New Patient (Initial Visit) Regina Mcconnell is a 50 y.o. female here for a consulton prolapse.)  History of Present Illness: Regina Mcconnell is a 50 y.o. White or Caucasian female presenting for evaluation of prolapse.    Urinary Symptoms: Leaks urine with cough/ sneeze, laughing, and exercise Leaks 5 time(s) per day.  Pad use: liners/ mini-pads per day.   She is bothered by her UI symptoms.  Day time voids 5.  Nocturia: 0 times per night to void. Voiding dysfunction: she empties her bladder well.  does not use a catheter to empty bladder.  When urinating, she feels the need to urinate multiple times in a row  UTIs:  0  UTI's in the last year.   Denies history of blood in urine and kidney or bladder stones  Pelvic Organ Prolapse Symptoms:                  She Admits to a feeling of a bulge the vaginal area. It has been present for 1 year.  She Admits to seeing a bulge.  This bulge is bothersome.  Bowel Symptom: Bowel movements: 1 time(s) per day Stool consistency: hard or soft  Straining: no.  Splinting: no.  Incomplete evacuation: no.  She Denies accidental bowel leakage / fecal incontinence Bowel regimen: none Last colonoscopy: pending  Sexual Function Sexually active: no.  Pain with sex: Yes, deep in the pelvis, has discomfort due to prolapse  Pelvic Pain Denies pelvic pain   Past Medical History:  Past Medical History:  Diagnosis Date   Abnormal Pap smear of cervix 1995   --hx colposcopy w/bx and cryotherapy of cervix(paps normal since)   Abnormal uterine bleeding    reason for ablation in 2009   Heart murmur    Pneumonia    03/2020    PONV (postoperative nausea and vomiting)    Urinary incontinence    especially with exercise     Past Surgical History:   Past  Surgical History:  Procedure Laterality Date   BREAST REDUCTION SURGERY Bilateral 03/03/2020   Procedure: BREAST REDUCTION WITH LIPOSUCTION;  Surgeon: Wallace Going, DO;  Location: [facility];  Service: Plastics;  Laterality: Bilateral;   DILATION AND CURETTAGE OF UTERUS     due to heavy cycles   ENDOMETRIAL ABLATION  08/29/2007   Dr. Quincy Simmonds   REDUCTION MAMMAPLASTY       Past OB/GYN History: OB History  Gravida Para Term Preterm AB Living  1 1 1     1   SAB IAB Ectopic Multiple Live Births          1    # Outcome Date GA Lbr Len/2nd Weight Sex Delivery Anes PTL Lv  1 Term             Vaginal deliveries: 1,  Forceps/ Vacuum deliveries: 0, Cesarean section: 0 Menopausal: Yes, Denies vaginal bleeding since menopause Last pap smear was 2020.  Any history of abnormal pap smears: no.   Medications: She has a current medication list which includes the following prescription(s): triamcinolone cream and ozempic (0.25 or 0.5 mg/dose).   Allergies: Patient has No Known Allergies.   Social History:  Social History   Tobacco Use   Smoking status: Never   Smokeless tobacco: Never  Vaping Use   Vaping Use: Never  used  Substance Use Topics   Alcohol use: Yes    Alcohol/week: 0.0 standard drinks    Comment: once or twice a year   Drug use: No    Relationship status: married She lives with spouse and daughter.   She is employed as an Surveyor, quantity. Regular exercise: No History of abuse: No  Family History:   Family History  Problem Relation Age of Onset   Diabetes Mother    Thyroid disease Mother    Heart disease Father    Heart disease Paternal Uncle        all deceased from heart attacks   Stroke Paternal Grandmother    Breast cancer Neg Hx      Review of Systems: Review of Systems  Constitutional:  Negative for fever, malaise/fatigue and weight loss.  Respiratory:  Negative for cough, shortness of breath and wheezing.   Cardiovascular:   Negative for chest pain, palpitations and leg swelling.  Gastrointestinal:  Negative for abdominal pain and blood in stool.  Genitourinary:  Negative for dysuria.  Musculoskeletal:  Negative for myalgias.  Skin:  Negative for rash.  Neurological:  Negative for dizziness and headaches.  Endo/Heme/Allergies:  Does not bruise/bleed easily.       + hot flashes  Psychiatric/Behavioral:  Negative for depression. The patient is not nervous/anxious.     OBJECTIVE Physical Exam: Vitals:   06/03/21 1530  BP: 122/83  Pulse: 79  Weight: 222 lb (100.7 kg)  Height: 5\' 4"  (1.626 m)    Physical Exam Constitutional:      General: She is not in acute distress. Pulmonary:     Effort: Pulmonary effort is normal.  Abdominal:     General: There is no distension.     Palpations: Abdomen is soft.     Tenderness: There is no abdominal tenderness. There is no rebound.  Musculoskeletal:        General: No swelling. Normal range of motion.  Skin:    General: Skin is warm and dry.     Findings: No rash.  Neurological:     Mental Status: She is alert and oriented to person, place, and time.  Psychiatric:        Mood and Affect: Mood normal.        Behavior: Behavior normal.     GU / Detailed Urogynecologic Evaluation:  Pelvic Exam: Normal external female genitalia; Bartholin's and Skene's glands normal in appearance; urethral meatus normal in appearance, no urethral masses or discharge.   CST: negative  Speculum exam reveals normal vaginal mucosa with atrophy. Cervix normal appearance. Uterus normal single, nontender. Adnexa no mass, fullness, tenderness.    Pelvic floor strength I/V  Pelvic floor musculature: Right levator non-tender, Right obturator non-tender, Left levator non-tender, Left obturator non-tender  POP-Q:   POP-Q  1                                            Aa   1                                           Ba  -4  C   5                                             Gh  2                                            Pb  9.5                                            tvl   -2                                            Ap  -2                                            Bp  -7                                              D     Rectal Exam:  Normal external rectum  Post-Void Residual (PVR) by Bladder Scan: In order to evaluate bladder emptying, we discussed obtaining a postvoid residual and she agreed to this procedure.  Procedure: The ultrasound unit was placed on the patient's abdomen in the suprapubic region after the patient had voided. A PVR of 6 ml was obtained by bladder scan.  Laboratory Results: POC urine: + nitrites   ASSESSMENT AND PLAN Ms. Welker is a 50 y.o. with:  1. Prolapse of anterior vaginal wall   2. Uterovaginal prolapse, incomplete   3. SUI (stress urinary incontinence, female)   4. Urinary frequency     1.Stage II anterior, Stage I posterior, Stage I apical prolapse - For treatment of pelvic organ prolapse, we discussed options for management including expectant management, conservative management, and surgical management, such as Kegels, a pessary, pelvic floor physical therapy, and specific surgical procedures. - We discussed two options for prolapse repair:  1) vaginal repair without mesh - Pros - safer, no mesh complications - Cons - not as strong as mesh repair, higher risk of recurrence  2) laparoscopic repair with mesh - Pros - stronger, better long-term success - Cons - risks of mesh implant (erosion into vagina or bladder, adhering to the rectum, pain) - these risks are lower than with a vaginal mesh but still exist - She is interested in Robotic TLH and sacrocolpopexy.   2. SUI - For treatment of stress urinary incontinence,  non-surgical options include expectant management, weight loss, physical therapy, as well as a pessary.  Surgical options include a midurethral  sling, Burch urethropexy, and transurethral injection of a bulking agent. - She would like a sling at the time of prolapse repair. Will have her return for a simple CMG to demonstrate leakage.   3. POC urine with nitrites today. She  does not have symptoms but will send for culture to ensure no infection prior to surgical intervention  Return for simple CMG test  Jaquita Folds, MD   Time spent: I spent 45 minutes dedicated to the care of this patient on the date of this encounter to include pre-visit review of records, face-to-face time with the patient discussing treatment options and post visit documentation and ordering medication/ testing.

## 2021-06-03 ENCOUNTER — Encounter: Payer: Self-pay | Admitting: Obstetrics and Gynecology

## 2021-06-03 ENCOUNTER — Other Ambulatory Visit: Payer: Self-pay

## 2021-06-03 ENCOUNTER — Ambulatory Visit (INDEPENDENT_AMBULATORY_CARE_PROVIDER_SITE_OTHER): Payer: No Typology Code available for payment source | Admitting: Obstetrics and Gynecology

## 2021-06-03 VITALS — BP 122/83 | HR 79 | Ht 64.0 in | Wt 222.0 lb

## 2021-06-03 DIAGNOSIS — N393 Stress incontinence (female) (male): Secondary | ICD-10-CM

## 2021-06-03 DIAGNOSIS — N8110 Cystocele, unspecified: Secondary | ICD-10-CM

## 2021-06-03 DIAGNOSIS — N3000 Acute cystitis without hematuria: Secondary | ICD-10-CM

## 2021-06-03 DIAGNOSIS — N812 Incomplete uterovaginal prolapse: Secondary | ICD-10-CM

## 2021-06-03 DIAGNOSIS — R350 Frequency of micturition: Secondary | ICD-10-CM

## 2021-06-03 LAB — POCT URINALYSIS DIPSTICK
Appearance: NORMAL
Bilirubin, UA: NEGATIVE
Blood, UA: NEGATIVE
Glucose, UA: NEGATIVE
Ketones, UA: NEGATIVE
Leukocytes, UA: NEGATIVE
Nitrite, UA: POSITIVE
Protein, UA: NEGATIVE
Spec Grav, UA: 1.02 (ref 1.010–1.025)
Urobilinogen, UA: 0.2 E.U./dL
pH, UA: 6 (ref 5.0–8.0)

## 2021-06-07 ENCOUNTER — Other Ambulatory Visit (HOSPITAL_BASED_OUTPATIENT_CLINIC_OR_DEPARTMENT_OTHER): Payer: Self-pay

## 2021-06-08 ENCOUNTER — Other Ambulatory Visit (HOSPITAL_BASED_OUTPATIENT_CLINIC_OR_DEPARTMENT_OTHER): Payer: Self-pay

## 2021-06-08 LAB — URINE CULTURE

## 2021-06-08 MED ORDER — NITROFURANTOIN MONOHYD MACRO 100 MG PO CAPS
100.0000 mg | ORAL_CAPSULE | Freq: Two times a day (BID) | ORAL | 0 refills | Status: AC
  Filled 2021-06-08: qty 10, 5d supply, fill #0

## 2021-06-08 NOTE — Progress Notes (Signed)
Pt was notified of the message to pick up medication from the pharmacy

## 2021-06-08 NOTE — Addendum Note (Signed)
Addended by: Jaquita Folds on: 06/08/2021 01:50 PM   Modules accepted: Orders

## 2021-06-13 ENCOUNTER — Encounter: Payer: Self-pay | Admitting: Physician Assistant

## 2021-06-13 ENCOUNTER — Ambulatory Visit (INDEPENDENT_AMBULATORY_CARE_PROVIDER_SITE_OTHER): Payer: No Typology Code available for payment source

## 2021-06-13 ENCOUNTER — Ambulatory Visit (INDEPENDENT_AMBULATORY_CARE_PROVIDER_SITE_OTHER): Payer: No Typology Code available for payment source | Admitting: Physician Assistant

## 2021-06-13 ENCOUNTER — Other Ambulatory Visit: Payer: Self-pay

## 2021-06-13 VITALS — BP 147/88 | HR 71 | Temp 98.1°F | Wt 222.0 lb

## 2021-06-13 DIAGNOSIS — R1032 Left lower quadrant pain: Secondary | ICD-10-CM | POA: Diagnosis not present

## 2021-06-13 DIAGNOSIS — R110 Nausea: Secondary | ICD-10-CM

## 2021-06-13 NOTE — Progress Notes (Signed)
Subjective:    Patient ID: Regina Mcconnell, female    DOB: 26-Apr-1971, 50 y.o.   MRN: [id number]  HPI Pt is an obese 50 yo woman who presents to the clinic with 4 days of left lower quadrant pain. Pain comes and goes. Does seem to be getting a little better. She had asymptomatic UTI treated last week and on macrobid. No urinary symptoms. Pain is left lower and worse when you push on it. She is nauseated but not vomiting. No fever, chills, diarrhea, body aches, cough, URI symptoms, SOB. She did just restart ozempic back. She has not had a bowel movement in at least 2 days which is a little unusual.   .. Active Ambulatory Problems    Diagnosis Date Noted   Family history of early CAD 07/23/2012   Family history of diabetes mellitus 07/23/2012   Heart murmur 07/23/2012   Poison ivy dermatitis 01/02/2013   Elevated liver enzymes 05/23/2017   Pain of upper abdomen 05/23/2017   Spleen enlarged 05/23/2017   Pulmonary nodule 06/19/2017   Class 3 severe obesity due to excess calories without serious comorbidity with body mass index (BMI) of 40.0 to 44.9 in adult ([place]) 02/05/2018   B12 nutritional deficiency 02/06/2018   Morbid obesity ([place]) 01/01/2019   Symptomatic mammary hypertrophy 01/01/2019   Incomplete uterovaginal prolapse 04/01/2019   Stress incontinence 04/01/2019   Vitamin D insufficiency 04/30/2019   Hyperlipidemia LDL goal <130 05/01/2019   Elevated fasting glucose 05/01/2019   Back pain 10/27/2019   S/P bilateral breast reduction 03/26/2020   Elevated LDL cholesterol level 02/24/2021   Left lower quadrant pain 06/14/2021   Nausea 06/14/2021   Resolved Ambulatory Problems    Diagnosis Date Noted   Acute low back pain with disc symptoms, duration less than 6 weeks 06/03/2012   Past Medical History:  Diagnosis Date   Abnormal Pap smear of cervix 1995   Abnormal uterine bleeding    Pneumonia    PONV (postoperative nausea and vomiting)    Urinary incontinence        Review of Systems See HPI.     Objective:   Physical Exam Vitals reviewed.  Constitutional:      Appearance: Normal appearance. She is obese.  HENT:     Head: Normocephalic.  Cardiovascular:     Rate and Rhythm: Normal rate and regular rhythm.  Pulmonary:     Effort: Pulmonary effort is normal.  Abdominal:     General: Bowel sounds are normal. There is no distension.     Palpations: Abdomen is soft. There is no mass.     Tenderness: There is abdominal tenderness. There is guarding. There is no right CVA tenderness, left CVA tenderness or rebound.     Hernia: No hernia is present.     Comments: Left lower quadrant guarding and tenderness.   Neurological:     General: No focal deficit present.     Mental Status: She is alert.  Psychiatric:        Mood and Affect: Mood normal.          Assessment & Plan:  Marland KitchenMarland KitchenTnia was seen today for left lower quadrant pain.  Diagnoses and all orders for this visit:  Left lower quadrant pain -     US Pelvic Complete With Transvaginal; Future  Nausea -     US Pelvic Complete With Transvaginal; Future  Unclear etiology ovarian cyst, diverticulitis, constipation.  Pt is older but has hx of ovarian cyst. Ultrasound  to rule out ovarian cyst.  Negative pelvic ultrasound.   She did just restart ozempic and not had a bowel movement in last 2 days. Since fairly common complication go home and take some miralax and see if you can have bowel movement. If symptoms worsening or not improving will get labs and CT of abdomen.

## 2021-06-13 NOTE — Progress Notes (Signed)
Able to see left ovary but no abnormality. Need to get CT if still having pain.

## 2021-06-14 ENCOUNTER — Encounter: Payer: Self-pay | Admitting: Physician Assistant

## 2021-06-14 DIAGNOSIS — R110 Nausea: Secondary | ICD-10-CM | POA: Insufficient documentation

## 2021-06-14 DIAGNOSIS — R1032 Left lower quadrant pain: Secondary | ICD-10-CM | POA: Insufficient documentation

## 2021-06-17 ENCOUNTER — Other Ambulatory Visit: Payer: Self-pay

## 2021-06-17 ENCOUNTER — Telehealth: Payer: Self-pay | Admitting: Physician Assistant

## 2021-06-17 ENCOUNTER — Other Ambulatory Visit: Payer: Self-pay | Admitting: Physician Assistant

## 2021-06-17 ENCOUNTER — Other Ambulatory Visit (HOSPITAL_BASED_OUTPATIENT_CLINIC_OR_DEPARTMENT_OTHER): Payer: Self-pay

## 2021-06-17 ENCOUNTER — Ambulatory Visit (INDEPENDENT_AMBULATORY_CARE_PROVIDER_SITE_OTHER): Payer: No Typology Code available for payment source

## 2021-06-17 DIAGNOSIS — R1032 Left lower quadrant pain: Secondary | ICD-10-CM

## 2021-06-17 MED ORDER — CIPROFLOXACIN HCL 500 MG PO TABS
500.0000 mg | ORAL_TABLET | Freq: Two times a day (BID) | ORAL | 0 refills | Status: AC
  Filled 2021-06-17: qty 20, 10d supply, fill #0

## 2021-06-17 MED ORDER — METRONIDAZOLE 500 MG PO TABS
500.0000 mg | ORAL_TABLET | Freq: Three times a day (TID) | ORAL | 0 refills | Status: AC
  Filled 2021-06-17: qty 30, 10d supply, fill #0

## 2021-06-17 MED ORDER — IOHEXOL 300 MG/ML  SOLN
100.0000 mL | Freq: Once | INTRAMUSCULAR | Status: AC | PRN
  Administered 2021-06-17: 100 mL via INTRAVENOUS

## 2021-06-17 NOTE — Telephone Encounter (Signed)
Pt continues to have left lower quadrant pain. She had a good bowel movement and the pain had improved and then came back. The pain will bring her to her knees. CT ordered.

## 2021-06-20 NOTE — Progress Notes (Signed)
Acute diverticulitis. Pt called on 10/21 and sent metronidazole and cipro.  She has order in for colonoscopy. Suggest to give them a call to schedule.  Known cystocele.

## 2021-06-27 NOTE — Progress Notes (Signed)
[facility] Return Visit  SUBJECTIVE  History of Present Illness: Regina Mcconnell is a 50 y.o. female seen in follow-up for simple CMG testing.   Past Medical History: Patient  has a past medical history of Abnormal Pap smear of cervix (1995), Abnormal uterine bleeding, Heart murmur, Pneumonia, PONV (postoperative nausea and vomiting), and Urinary incontinence.   Past Surgical History: She  has a past surgical history that includes Endometrial ablation (08/29/2007); Dilation and curettage of uterus; Breast reduction surgery (Bilateral, 03/03/2020); and Reduction mammaplasty.   Medications: She has a current medication list which includes the following prescription(s): ozempic (0.25 or 0.5 mg/dose) and triamcinolone cream.   Allergies: Patient has No Known Allergies.   Social History: Patient  reports that she has never smoked. She has never used smokeless tobacco. She reports current alcohol use. She reports that she does not use drugs.      OBJECTIVE     Physical Exam: Vitals:   06/28/21 1604  BP: (!) 147/90  Pulse: 69  Weight: 222 lb (100.7 kg)   Gen: No apparent distress, A&O x 3.  Detailed Urogynecologic Evaluation:  Deferred. Prior exam showed: POP-Q:    POP-Q   1                                            Aa   1                                           Ba   -4                                              C    5                                            Gh   2                                            Pb   9.5                                            tvl    -2                                            Ap   -2                                            Bp   -7  D       Verbal consent was obtained to perform simple CMG procedure:   Prolapse was reduced using 2 large cotton swabs. Urethra was prepped with betadine and a 72F catheter was placed and bladder was drained completely. The  bladder was then backfilled with sterile water by gravity.  First sensation: 120 First Desire: 170 Strong Desire: 300 Capacity: 400 Cough stress test was positive. Valsalva stress test was not done.  Catheter was replaced to empty bladder.   Interpretation: CMG showed normal sensation, and normal cystometric capacity. Findings positive for stress incontinence, negative for detrusor overactivity.       ASSESSMENT AND PLAN    Ms. Kinkaid is a 50 y.o. with:  1. Prolapse of anterior vaginal wall   2. SUI (stress urinary incontinence, female)     Plan for surgery: Exam under anesthesia, robotic assisted total laparoscopic hysterectomy, bilateral salpingectomy, sacrocolpopexy, midurethral sling and cystoscopy  - We reviewed the patient's specific anatomic and functional findings, with the assistance of diagrams, and together finalized the above procedure. The planned surgical procedures were discussed. Additional treatment options including expectant management, conservative management, medical management were discussed where appropriate.  We reviewed the benefits and risks of each treatment option.   - For preop Visit:  She is required to have a visit within 30 days of her surgery.   - Medical clearance: not required  - Anticoagulant use: No - Medicaid Hysterectomy form: No - Accepts blood transfusion: Yes- will need to confirm at pre op - Expected length of stay: outpatient  Request sent for surgery scheduling.   Jaquita Folds, MD  Time spent: I spent 15 minutes dedicated to the care of this patient on the date of this encounter to include pre-visit review of records, face-to-face time with the patient discussing surgery and post visit documentation. Additional time was spent on the procedure.

## 2021-06-28 ENCOUNTER — Encounter: Payer: Self-pay | Admitting: Obstetrics and Gynecology

## 2021-06-28 ENCOUNTER — Other Ambulatory Visit: Payer: Self-pay

## 2021-06-28 ENCOUNTER — Ambulatory Visit (INDEPENDENT_AMBULATORY_CARE_PROVIDER_SITE_OTHER): Payer: No Typology Code available for payment source | Admitting: Obstetrics and Gynecology

## 2021-06-28 VITALS — BP 147/90 | HR 69 | Wt 222.0 lb

## 2021-06-28 DIAGNOSIS — N8110 Cystocele, unspecified: Secondary | ICD-10-CM

## 2021-06-28 DIAGNOSIS — N393 Stress incontinence (female) (male): Secondary | ICD-10-CM

## 2021-08-02 ENCOUNTER — Other Ambulatory Visit (HOSPITAL_BASED_OUTPATIENT_CLINIC_OR_DEPARTMENT_OTHER): Payer: Self-pay

## 2021-08-02 ENCOUNTER — Other Ambulatory Visit: Payer: Self-pay | Admitting: Physician Assistant

## 2021-08-02 MED ORDER — SEMAGLUTIDE (1 MG/DOSE) 4 MG/3ML ~~LOC~~ SOPN
1.0000 mg | PEN_INJECTOR | SUBCUTANEOUS | 0 refills | Status: DC
  Filled 2021-08-02: qty 3, 28d supply, fill #0
  Filled 2021-08-30: qty 6, 56d supply, fill #1

## 2021-08-17 ENCOUNTER — Encounter: Payer: No Typology Code available for payment source | Admitting: Obstetrics and Gynecology

## 2021-08-30 ENCOUNTER — Other Ambulatory Visit (HOSPITAL_BASED_OUTPATIENT_CLINIC_OR_DEPARTMENT_OTHER): Payer: Self-pay

## 2021-08-31 ENCOUNTER — Encounter (HOSPITAL_BASED_OUTPATIENT_CLINIC_OR_DEPARTMENT_OTHER): Payer: Self-pay | Admitting: *Deleted

## 2021-09-01 ENCOUNTER — Telehealth: Payer: No Typology Code available for payment source | Admitting: Obstetrics and Gynecology

## 2021-09-01 ENCOUNTER — Other Ambulatory Visit: Payer: Self-pay

## 2021-09-01 ENCOUNTER — Encounter (HOSPITAL_BASED_OUTPATIENT_CLINIC_OR_DEPARTMENT_OTHER): Payer: Self-pay | Admitting: Obstetrics and Gynecology

## 2021-09-01 NOTE — Progress Notes (Signed)
PLEASE WEAR A MASK OUT IN PUBLIC AND SOCIAL DISTANCE AND [facility] YOUR HANDS FREQUENTLY. PLEASE ASK ALL YOUR CLOSE HOUSEHOLD CONTACT TO WEAR MASK OUT IN PUBLIC AND SOCIAL DISTANCE AND [place] HANDS FREQUENTLY ALSO.      Your procedure is scheduled on 09-12-2021  Report to [facility].   Call this number if you have problems the morning of surgery  :[phone].   OUR ADDRESS IS [address].  WE ARE LOCATED IN [facility].  PLEASE BRING YOUR INSURANCE CARD AND PHOTO ID DAY OF SURGERY.  ONLY ONE PERSON ALLOWED IN FACILITY WAITING AREA.                                     REMEMBER:  DO NOT EAT FOOD, CANDY GUM OR MINTS  AFTER MIDNIGHT THE NIGHT BEFORE YOUR SURGERY . YOU MAY HAVE CLEAR LIQUIDS FROM MIDNIGHT THE NIGHT BEFORE YOUR SURGERY UNTIL 430 AM. NO CLEAR LIQUIDS AFTER 430 AM DAY OF SURGERY.   YOU MAY  BRUSH YOUR TEETH MORNING OF SURGERY AND RINSE YOUR MOUTH OUT, NO CHEWING GUM CANDY OR MINTS.    CLEAR LIQUID DIET   Foods Allowed                                                                     Foods Excluded  Coffee and tea, regular and decaf                             liquids that you cannot  Plain Jell-O any favor except red or purple                                           see through such as: Fruit ices (not with fruit pulp)                                     milk, soups, orange juice  Iced Popsicles                                    All solid food Carbonated beverages, regular and diet                                    Cranberry, grape and apple juices Sports drinks like Gatorade  Sample Menu Breakfast                                Lunch                                     Supper  Cranberry juice                                           Jell-O                                     Grape juice                           Apple juice Coffee or tea                        Jell-O                                       Popsicle                                                Coffee or tea                        Coffee or tea  _____________________________________________________________________     TAKE THESE MEDICATIONS MORNING OF SURGERY WITH A SIP OF WATER:  NONE  ONE VISITOR IS ALLOWED IN WAITING ROOM ONLY DAY OF SURGERY.  YOU MAY HAVE ANOTHER PERSON SWITCH OUT WITH THE  1  VISITOR IN THE WAITING ROOM DAY OF SURGERY AND A MASK MUST BE WORN IN THE WAITING ROOM.    2 VISITORS  MAY VISIT IN THE EXTENDED RECOVERY ROOM UNTIL 800 PM ONLY 1 VISITOR AGE 51 AND OVER MAY SPEND THE NIGHT AND MUST BE IN EXTENDED RECOVERY ROOM NO LATER THAN 800 PM .    UP TO 2 CHILDREN AGE 23 TO 15 MAY ALSO VISIT IN EXTENDED RECOV ERY ROOM ONLY UNTIL 800 PM AND MUST LEAVE BY 800 PM. ALL PERSONS VISITING IN EXTENDED RECOVERY ROOM MUST WEAR A MASK.                                    DO NOT WEAR JEWERLY, MAKE UP. DO NOT WEAR LOTIONS, POWDERS, PERFUMES OR NAIL POLISH ON YOUR FINGERNAILS. TOENAIL POLISH IS OK TO WEAR. DO NOT SHAVE FOR 48 HOURS PRIOR TO DAY OF SURGERY. MEN MAY SHAVE FACE AND NECK. CONTACTS, GLASSES, OR DENTURES MAY NOT BE WORN TO SURGERY.                                    [facility] IS NOT RESPONSIBLE  FOR ANY BELONGINGS.                                                                    Marland Kitchen   - Preparing for Surgery Before surgery, you can play an important role.  Because skin is not sterile, your skin needs to be as free of germs as possible.  You can reduce the number of germs on your skin by washing with CHG (chlorahexidine gluconate) soap before surgery.  CHG is an antiseptic cleaner which kills germs and bonds with the skin to continue killing germs even after washing. Please DO NOT use if you have an allergy to CHG or antibacterial soaps.  If your skin becomes reddened/irritated stop using the CHG and inform your nurse when you arrive at Short Stay. Do not shave (including legs and underarms)  for at least 48 hours prior to the first CHG shower.  You may shave your face/neck. Please follow these instructions carefully:  1.  Shower with CHG Soap the night before surgery and the  morning of Surgery.  2.  If you choose to wash your hair, wash your hair first as usual with your  normal  shampoo.  3.  After you shampoo, rinse your hair and body thoroughly to remove the  shampoo.                            4.  Use CHG as you would any other liquid soap.  You can apply chg directly  to the skin and wash                      Gently with a scrungie or clean washcloth.  5.  Apply the CHG Soap to your body ONLY FROM THE NECK DOWN.   Do not use on face/ open                           Wound or open sores. Avoid contact with eyes, ears mouth and genitals (private parts).                       Wash face,  Genitals (private parts) with your normal soap.             6.  Wash thoroughly, paying special attention to the area where your surgery  will be performed.  7.  Thoroughly rinse your body with warm water from the neck down.  8.  DO NOT shower/wash with your normal soap after using and rinsing off  the CHG Soap.                9.  Pat yourself dry with a clean towel.            10.  Wear clean pajamas.            11.  Place clean sheets on your bed the night of your first shower and do not  sleep with pets. Day of Surgery : Do not apply any lotions/deodorants the morning of surgery.  Please wear clean clothes to the hospital/surgery center.  IF YOU HAVE ANY SKIN IRRITATION OR PROBLEMS WITH THE SURGICAL SOAP, PLEASE GET A BAR OF GOLD DIAL SOAP AND SHOWER THE NIGHT BEFORE YOUR SURGERY AND THE MORNING OF YOUR SURGERY. PLEASE LET THE NURSE KNOW MORNING OF YOUR SURGERY IF YOU HAD ANY PROBLEMS WITH THE SURGICAL SOAP.  FAILURE TO FOLLOW THESE INSTRUCTIONS MAY RESULT IN THE CANCELLATION OF YOUR SURGERY PATIENT SIGNATURE_________________________________  NURSE  SIGNATURE__________________________________  ________________________________________________________________________  QUESTIONS CALL [facility] PRE OP NURSE PHONE [phone].

## 2021-09-01 NOTE — Progress Notes (Signed)
Spoke w/ via phone for pre-op interview---pt Lab needs dos----  urine preg             Lab results------lab appt 09-06-2021 [facility] t & s COVID test -----patient states asymptomatic no test needed Arrive at -------530 am 09-12-2021 NPO after MN NO Solid Food.  Clear liquids from MN until---430 am Med rec completed Medications to take morning of surgery -----none Diabetic medication -----n/a Patient instructed no nail polish to be worn day of surgery Patient instructed to bring photo id and insurance card day of surgery Patient aware to have Driver (ride ) / caregiver    for 24 hours after surgery  husband Regina Mcconnell Patient Special Instructions -----pt given extended recovery instructions Pre-Op special Istructions -----none Patient verbalized understanding of instructions that were given at this phone interview. Patient denies shortness of breath, chest pain, fever, cough at this phone interview.

## 2021-09-06 ENCOUNTER — Encounter (HOSPITAL_COMMUNITY)
Admission: RE | Admit: 2021-09-06 | Discharge: 2021-09-06 | Disposition: A | Discharge status: Home / Self Care | Payer: No Typology Code available for payment source | Source: Ambulatory Visit | Attending: Obstetrics and Gynecology | Admitting: Obstetrics and Gynecology

## 2021-09-06 ENCOUNTER — Other Ambulatory Visit: Payer: Self-pay

## 2021-09-06 VITALS — BP 117/90 | HR 86 | Temp 98.5°F | Resp 16 | Ht 64.0 in | Wt 204.0 lb

## 2021-09-06 DIAGNOSIS — Z01812 Encounter for preprocedural laboratory examination: Secondary | ICD-10-CM | POA: Diagnosis not present

## 2021-09-06 DIAGNOSIS — N812 Incomplete uterovaginal prolapse: Secondary | ICD-10-CM | POA: Diagnosis not present

## 2021-09-06 DIAGNOSIS — Z01818 Encounter for other preprocedural examination: Secondary | ICD-10-CM

## 2021-09-06 LAB — CBC
HCT: 44 % (ref 36.0–46.0)
Hemoglobin: 14.9 g/dL (ref 12.0–15.0)
MCH: 29.8 pg (ref 26.0–34.0)
MCHC: 33.9 g/dL (ref 30.0–36.0)
MCV: 88 fL (ref 80.0–100.0)
Platelets: 244 10*3/uL (ref 150–400)
RBC: 5 MIL/uL (ref 3.87–5.11)
RDW: 12.5 % (ref 11.5–15.5)
WBC: 5.9 10*3/uL (ref 4.0–10.5)
nRBC: 0 % (ref 0.0–0.2)

## 2021-09-07 ENCOUNTER — Telehealth (INDEPENDENT_AMBULATORY_CARE_PROVIDER_SITE_OTHER): Payer: No Typology Code available for payment source | Admitting: Obstetrics and Gynecology

## 2021-09-07 ENCOUNTER — Other Ambulatory Visit (HOSPITAL_BASED_OUTPATIENT_CLINIC_OR_DEPARTMENT_OTHER): Payer: Self-pay

## 2021-09-07 DIAGNOSIS — Z01818 Encounter for other preprocedural examination: Secondary | ICD-10-CM

## 2021-09-07 MED ORDER — POLYETHYLENE GLYCOL 3350 17 GM/SCOOP PO POWD
17.0000 g | Freq: Every day | ORAL | 0 refills | Status: DC
  Filled 2021-09-07: qty 238, 14d supply, fill #0

## 2021-09-07 MED ORDER — OXYCODONE HCL 5 MG PO TABS
5.0000 mg | ORAL_TABLET | ORAL | 0 refills | Status: DC | PRN
  Filled 2021-09-07: qty 10, 2d supply, fill #0

## 2021-09-07 MED ORDER — IBUPROFEN 600 MG PO TABS
600.0000 mg | ORAL_TABLET | Freq: Four times a day (QID) | ORAL | 0 refills | Status: DC | PRN
  Filled 2021-09-07: qty 30, 8d supply, fill #0

## 2021-09-07 MED ORDER — ACETAMINOPHEN 500 MG PO TABS
500.0000 mg | ORAL_TABLET | Freq: Four times a day (QID) | ORAL | 0 refills | Status: DC | PRN
  Filled 2021-09-07: qty 30, 8d supply, fill #0

## 2021-09-07 NOTE — H&P (Signed)
[facility] Pre-Operative H&P  Subjective Chief Complaint: Regina Mcconnell presents for a preoperative encounter.   History of Present Illness: Regina Mcconnell is a 51 y.o. female who presents for preoperative visit.  She is scheduled to undergo Exam under anesthesia, robotic assisted total laparoscopic hysterectomy, bilateral salpingectomy, sacrocolpopexy, midurethral sling and cystoscopy on 09/12/21.  Her symptoms include vaginal bulge and leakage, and she was was found to have Stage II anterior, Stage I posterior, Stage I apical prolapse   CMG Interpretation: CMG showed normal sensation, and normal cystometric capacity. Findings positive for stress incontinence, negative for detrusor overactivity.   Past Medical History:  Diagnosis Date   Abnormal Pap smear of cervix 1995   --hx colposcopy w/bx and cryotherapy of cervix(paps normal since)   Heart murmur    as child and young child none since   History of COVID-19 03/2021   diarrhea x 3 days cough /fever x 2 days all symptoms resolved   Pneumonia    03/2020    PONV (postoperative nausea and vomiting)    pt requests scopolamine patch dos   Urinary incontinence    especially with exercise   Wears glasses    or contacts     Past Surgical History:  Procedure Laterality Date   BREAST REDUCTION SURGERY Bilateral 03/03/2020   Procedure: BREAST REDUCTION WITH LIPOSUCTION;  Surgeon: Wallace Going, DO;  Location: [facility];  Service: Plastics;  Laterality: Bilateral;   DILATION AND CURETTAGE OF UTERUS     due to heavy cycles   ENDOMETRIAL ABLATION  08/29/2007   Dr. Quincy Simmonds    has No Known Allergies.   Family History  Problem Relation Age of Onset   Diabetes Mother    Thyroid disease Mother    Heart disease Father    Heart disease Paternal Uncle        all deceased from heart attacks   Stroke Paternal Grandmother    Breast cancer Neg Hx     Social History   Tobacco Use   Smoking status: Never    Smokeless tobacco: Never  Vaping Use   Vaping Use: Never used  Substance Use Topics   Alcohol use: Yes    Alcohol/week: 0.0 standard drinks    Comment: once or twice a year   Drug use: Never     Review of Systems was negative for a full 10 system review except as noted in the History of Present Illness.  No current facility-administered medications for this encounter.  Current Outpatient Medications:    acetaminophen (TYLENOL) 500 MG tablet, Take 1,000 mg by mouth every 6 (six) hours as needed., Disp: , Rfl:    acetaminophen (TYLENOL) 500 MG tablet, Take 1 tablet (500 mg total) by mouth every 6 (six) hours as needed (pain)., Disp: 30 tablet, Rfl: 0   ibuprofen (ADVIL) 600 MG tablet, Take 1 tablet (600 mg total) by mouth every 6 (six) hours as needed., Disp: 30 tablet, Rfl: 0   oxyCODONE (OXY IR/ROXICODONE) 5 MG immediate release tablet, Take 1 tablet (5 mg total) by mouth every 4 (four) hours as needed for severe pain., Disp: 10 tablet, Rfl: 0   polyethylene glycol powder (GLYCOLAX/MIRALAX) 17 GM/SCOOP powder, Take 17 g by mouth daily. Drink 17g (1 scoop) dissolved in water per day., Disp: 238 g, Rfl: 0   Semaglutide, 1 MG/DOSE, 4 MG/3ML SOPN, Inject 1 mg as directed once a week. (Patient taking differently: Inject 1 mg as directed once a week.  Tuesdays for weight loss), Disp: 9 mL, Rfl: 0   Objective AAOx3  Previous Pelvic Exam showed: POP-Q:    POP-Q   1                                            Aa   1                                           Ba   -4                                              C    5                                            Gh   2                                            Pb   9.5                                            tvl    -2                                            Ap   -2                                            Bp   -7                                              D          Assessment/ Plan  Assessment: The patient  is a 51 y.o. year old with stage II POP and SUI. Plan for Exam under anesthesia, robotic assisted total laparoscopic hysterectomy, bilateral salpingectomy, sacrocolpopexy, midurethral sling and cystoscopy.   Jaquita Folds, MD

## 2021-09-07 NOTE — Progress Notes (Signed)
[facility]necology Pre-Operative visit- Video visit  Subjective Chief Complaint: Regina Mcconnell presents for a preoperative encounter.   History of Present Illness: Regina Mcconnell is a 51 y.o. female who presents for preoperative visit.  She is scheduled to undergo Exam under anesthesia, robotic assisted total laparoscopic hysterectomy, bilateral salpingectomy, sacrocolpopexy, midurethral sling and cystoscopy on 09/12/21.  Her symptoms include vaginal bulge and leakage, and she was was found to have Stage II anterior, Stage I posterior, Stage I apical prolapse   CMG Interpretation: CMG showed normal sensation, and normal cystometric capacity. Findings positive for stress incontinence, negative for detrusor overactivity.   Past Medical History:  Diagnosis Date   Abnormal Pap smear of cervix 1995   --hx colposcopy w/bx and cryotherapy of cervix(paps normal since)   Heart murmur    as child and young child none since   History of COVID-19 03/2021   diarrhea x 3 days cough /fever x 2 days all symptoms resolved   Pneumonia    03/2020    PONV (postoperative nausea and vomiting)    pt requests scopolamine patch dos   Urinary incontinence    especially with exercise   Wears glasses    or contacts     Past Surgical History:  Procedure Laterality Date   BREAST REDUCTION SURGERY Bilateral 03/03/2020   Procedure: BREAST REDUCTION WITH LIPOSUCTION;  Surgeon: Wallace Going, DO;  Location: [place];  Service: Plastics;  Laterality: Bilateral;   DILATION AND CURETTAGE OF UTERUS     due to heavy cycles   ENDOMETRIAL ABLATION  08/29/2007   Dr. Quincy Simmonds    has No Known Allergies.   Family History  Problem Relation Age of Onset   Diabetes Mother    Thyroid disease Mother    Heart disease Father    Heart disease Paternal Uncle        all deceased from heart attacks   Stroke Paternal Grandmother    Breast cancer Neg Hx     Social History   Tobacco Use    Smoking status: Never   Smokeless tobacco: Never  Vaping Use   Vaping Use: Never used  Substance Use Topics   Alcohol use: Yes    Alcohol/week: 0.0 standard drinks    Comment: once or twice a year   Drug use: Never     Review of Systems was negative for a full 10 system review except as noted in the History of Present Illness.   Current Outpatient Medications:    acetaminophen (TYLENOL) 500 MG tablet, Take 1,000 mg by mouth every 6 (six) hours as needed., Disp: , Rfl:    Semaglutide, 1 MG/DOSE, 4 MG/3ML SOPN, Inject 1 mg as directed once a week. (Patient taking differently: Inject 1 mg as directed once a week. Tuesdays for weight loss), Disp: 9 mL, Rfl: 0   Objective AAOx3  Previous Pelvic Exam showed: POP-Q:    POP-Q   1                                            Aa   1                                           Ba   -4  C    5                                            Gh   2                                            Pb   9.5                                            tvl    -2                                            Ap   -2                                            Bp   -7                                              D          Assessment/ Plan  Assessment: The patient is a 51 y.o. year old scheduled to undergo Exam under anesthesia, robotic assisted total laparoscopic hysterectomy, bilateral salpingectomy, sacrocolpopexy, midurethral sling and cystoscopy. Verbal consent was obtained for these procedures.  Plan: General Surgical Consent: The patient has previously been counseled on alternative treatments, and the decision by the patient and provider was to proceed with the procedure listed above.  For all procedures, there are risks of bleeding, infection, damage to surrounding organs including but not limited to bowel, bladder, blood vessels, ureters and nerves, and need for further surgery if an injury  were to occur. These risks are all low with minimally invasive surgery.   There are risks of numbness and weakness at any body site or buttock/rectal pain.  It is possible that baseline pain can be worsened by surgery, either with or without mesh. If surgery is vaginal, there is also a low risk of possible conversion to laparoscopy or open abdominal incision where indicated. Very rare risks include blood transfusion, blood clot, heart attack, pneumonia, or death.   There is also a risk of short-term postoperative urinary retention with need to use a catheter. About half of patients need to go home from surgery with a catheter, which is then later removed in the office. The risk of long-term need for a catheter is very low. There is also a risk of worsening of overactive bladder.   Sling: The effectiveness of a midurethral vaginal mesh sling is approximately 85%, and thus, there will be times when you may leak urine after surgery, especially if your bladder is full or if you have a strong cough. There is a balance between making the sling tight enough to treat your leakage but not too tight so that you have long-term  difficulty emptying your bladder. A mesh sling will not directly treat overactive bladder/urge incontinence and may worsen it.  There is an FDA safety notification on vaginal mesh procedures for prolapse but NOT mesh slings. We have extensive experience and training with mesh placement and we have close postoperative follow up to identify any potential complications from mesh. It is important to realize that this mesh is a permanent implant that cannot be easily removed. There are rare risks of mesh exposure (2-4%), pain with intercourse (0-7%), and infection (<1%). The risk of mesh exposure if more likely in a woman with risks for poor healing (prior radiation, poorly controlled diabetes, or immunocompromised). The risk of new or worsened chronic pain after mesh implant is more common in women  with baseline chronic pain and/or poorly controlled anxiety or depression. Approximately 2-4% of patients will experience longer-term post-operative voiding dysfunction that may require surgical revision of the sling. We also reviewed that postoperatively, her stream may not be as strong as before surgery.    Prolapse (with or without mesh): Risk factors for surgical failure  include things that put pressure on your pelvis and the surgical repair, including obesity, chronic cough, and heavy lifting or straining (including lifting children or adults, straining on the toilet, or lifting heavy objects such as furniture or anything weighing >25 lbs. Risks of recurrence is 20-30% with vaginal native tissue repair and a less than 10% with sacrocolpopexy with mesh.    Sacrocolpopexy: Mesh implants may provide more prolapse support, but do have some unique risks to consider. It is important to understand that mesh is permanent and cannot be easily removed. Risks of abdominal sacrocolpopexy mesh include mesh exposure (~3-6%), painful intercourse (recent studies show lower rates after surgery compared to before, with ~5-8% risk of new onset), and very rare risks of bowel or bladder injury or infection (<1%). The risk of mesh exposure is more likely in a woman with risks for poor healing (prior radiation, poorly controlled diabetes, or immunocompromised). The risk of new or worsened chronic pain after mesh implant is more common in women with baseline chronic pain and/or poorly controlled anxiety or depression. There is an FDA safety notification on vaginal mesh procedures for prolapse but NOT abdominal mesh procedures and therefore does not apply to your surgery. We have extensive experience and training with mesh placement and we have close postoperative follow up to identify any potential complications from mesh.    We discussed consent for blood products. Risks for blood transfusion include allergic reactions,  other reactions that can affect different body organs and managed accordingly, transmission of infectious diseases such as HIV or Hepatitis. However, the blood is screened. Patient consents for blood products.  Pre-operative instructions:  She was instructed to not take Aspirin/NSAIDs x 7days prior to surgery. Antibiotic prophylaxis was ordered as indicated.  Cathter use: Patient will go home with foley if needed after post-operative voiding trial.  Post-operative instructions:  She was provided with specific post-operative instructions, including precautions and signs/symptoms for which we would recommend contacting us, in addition to daytime and after-hours contact phone numbers. This was provided on a handout.   Post-operative medications: Prescriptions for motrin, tylenol, miralax, and oxycodone were sent to her pharmacy. Discussed using ibuprofen and tylenol on a schedule to limit use of narcotics.   Laboratory testing:  We will check labs: CBC, Type and screen  Preoperative clearance:  She does not require surgical clearance.    Post-operative follow-up:  A post-operative appointment will  be made for 6 weeks from the date of surgery. If she needs a post-operative nurse visit for a voiding trial, that will be set up after she leaves the hospital.    Patient will call the clinic or use MyChart should anything change or any new issues arise.   Jaquita Folds, MD

## 2021-09-07 NOTE — Patient Instructions (Signed)

## 2021-09-12 ENCOUNTER — Ambulatory Visit (HOSPITAL_BASED_OUTPATIENT_CLINIC_OR_DEPARTMENT_OTHER): Payer: No Typology Code available for payment source | Admitting: Certified Registered Nurse Anesthetist

## 2021-09-12 ENCOUNTER — Ambulatory Visit (HOSPITAL_BASED_OUTPATIENT_CLINIC_OR_DEPARTMENT_OTHER)
Admission: RE | Admit: 2021-09-12 | Discharge: 2021-09-12 | Disposition: A | Discharge status: Home / Self Care | Payer: No Typology Code available for payment source | Source: Ambulatory Visit | Attending: Obstetrics and Gynecology | Admitting: Obstetrics and Gynecology

## 2021-09-12 ENCOUNTER — Encounter (HOSPITAL_BASED_OUTPATIENT_CLINIC_OR_DEPARTMENT_OTHER): Payer: Self-pay | Admitting: Obstetrics and Gynecology

## 2021-09-12 ENCOUNTER — Telehealth: Payer: Self-pay | Admitting: Obstetrics and Gynecology

## 2021-09-12 ENCOUNTER — Encounter (HOSPITAL_BASED_OUTPATIENT_CLINIC_OR_DEPARTMENT_OTHER)
Admission: RE | Disposition: A | Discharge status: Home / Self Care | Payer: Self-pay | Source: Ambulatory Visit | Attending: Obstetrics and Gynecology

## 2021-09-12 DIAGNOSIS — N393 Stress incontinence (female) (male): Secondary | ICD-10-CM | POA: Diagnosis not present

## 2021-09-12 DIAGNOSIS — N8110 Cystocele, unspecified: Secondary | ICD-10-CM | POA: Diagnosis not present

## 2021-09-12 DIAGNOSIS — N812 Incomplete uterovaginal prolapse: Secondary | ICD-10-CM | POA: Diagnosis present

## 2021-09-12 HISTORY — PX: CYSTOSCOPY: SHX5120

## 2021-09-12 HISTORY — PX: ROBOTIC ASSISTED TOTAL HYSTERECTOMY: SHX6085

## 2021-09-12 HISTORY — PX: ROBOTIC ASSISTED LAPAROSCOPIC SACROCOLPOPEXY: SHX5388

## 2021-09-12 HISTORY — PX: BLADDER SUSPENSION: SHX72

## 2021-09-12 HISTORY — DX: Presence of spectacles and contact lenses: Z97.3

## 2021-09-12 LAB — TYPE AND SCREEN
ABO/RH(D): O NEG
Antibody Screen: NEGATIVE

## 2021-09-12 LAB — POCT PREGNANCY, URINE: Preg Test, Ur: NEGATIVE

## 2021-09-12 LAB — ABO/RH: ABO/RH(D): O NEG

## 2021-09-12 SURGERY — HYSTERECTOMY, TOTAL, ROBOT-ASSISTED
Anesthesia: General

## 2021-09-12 MED ORDER — PHENAZOPYRIDINE HCL 100 MG PO TABS
ORAL_TABLET | ORAL | Status: AC
  Filled 2021-09-12: qty 2

## 2021-09-12 MED ORDER — BUPIVACAINE HCL (PF) 0.25 % IJ SOLN
INTRAMUSCULAR | Status: DC | PRN
  Administered 2021-09-12: 10 mL

## 2021-09-12 MED ORDER — PHENYLEPHRINE 40 MCG/ML (10ML) SYRINGE FOR IV PUSH (FOR BLOOD PRESSURE SUPPORT)
PREFILLED_SYRINGE | INTRAVENOUS | Status: AC
  Filled 2021-09-12: qty 10

## 2021-09-12 MED ORDER — HEMOSTATIC AGENTS (NO CHARGE) OPTIME
TOPICAL | Status: DC | PRN
Start: 2021-09-12 — End: 2021-09-12
  Administered 2021-09-12: 1 via TOPICAL

## 2021-09-12 MED ORDER — KETOROLAC TROMETHAMINE 30 MG/ML IJ SOLN
INTRAMUSCULAR | Status: AC
  Filled 2021-09-12: qty 1

## 2021-09-12 MED ORDER — LIDOCAINE HCL (PF) 2 % IJ SOLN
INTRAMUSCULAR | Status: AC
  Filled 2021-09-12: qty 5

## 2021-09-12 MED ORDER — SIMETHICONE 80 MG PO CHEW: 80.0000 mg | CHEWABLE_TABLET | Freq: Four times a day (QID) | ORAL | Status: DC | PRN

## 2021-09-12 MED ORDER — FENTANYL CITRATE (PF) 100 MCG/2ML IJ SOLN
INTRAMUSCULAR | Status: AC
  Filled 2021-09-12: qty 2

## 2021-09-12 MED ORDER — FENTANYL CITRATE (PF) 250 MCG/5ML IJ SOLN
INTRAMUSCULAR | Status: AC
  Filled 2021-09-12: qty 5

## 2021-09-12 MED ORDER — CEFAZOLIN SODIUM-DEXTROSE 2-4 GM/100ML-% IV SOLN
INTRAVENOUS | Status: AC
  Filled 2021-09-12: qty 100

## 2021-09-12 MED ORDER — LIDOCAINE 2% (20 MG/ML) 5 ML SYRINGE
INTRAMUSCULAR | Status: DC | PRN
  Administered 2021-09-12: 60 mg via INTRAVENOUS

## 2021-09-12 MED ORDER — KETOROLAC TROMETHAMINE 30 MG/ML IJ SOLN: 30.0000 mg | Freq: Once | INTRAMUSCULAR | Status: DC

## 2021-09-12 MED ORDER — GABAPENTIN 300 MG PO CAPS
300.0000 mg | ORAL_CAPSULE | ORAL | Status: AC
  Administered 2021-09-12: 300 mg via ORAL

## 2021-09-12 MED ORDER — CEFAZOLIN SODIUM-DEXTROSE 2-4 GM/100ML-% IV SOLN
2.0000 g | INTRAVENOUS | Status: AC
  Administered 2021-09-12: 2 g via INTRAVENOUS

## 2021-09-12 MED ORDER — DEXAMETHASONE SODIUM PHOSPHATE 10 MG/ML IJ SOLN
INTRAMUSCULAR | Status: DC | PRN
  Administered 2021-09-12: 10 mg via INTRAVENOUS

## 2021-09-12 MED ORDER — DEXAMETHASONE SODIUM PHOSPHATE 10 MG/ML IJ SOLN
INTRAMUSCULAR | Status: AC
  Filled 2021-09-12: qty 1

## 2021-09-12 MED ORDER — ROCURONIUM BROMIDE 10 MG/ML (PF) SYRINGE
PREFILLED_SYRINGE | INTRAVENOUS | Status: AC
  Filled 2021-09-12: qty 10

## 2021-09-12 MED ORDER — ROCURONIUM BROMIDE 10 MG/ML (PF) SYRINGE
PREFILLED_SYRINGE | INTRAVENOUS | Status: DC | PRN
  Administered 2021-09-12 (×2): 20 mg via INTRAVENOUS
  Administered 2021-09-12: 80 mg via INTRAVENOUS

## 2021-09-12 MED ORDER — ACETAMINOPHEN 325 MG PO TABS: 650.0000 mg | ORAL_TABLET | ORAL | Status: DC | PRN

## 2021-09-12 MED ORDER — LIDOCAINE-EPINEPHRINE 1 %-1:100000 IJ SOLN
INTRAMUSCULAR | Status: DC | PRN
  Administered 2021-09-12: 6 mL

## 2021-09-12 MED ORDER — PROPOFOL 10 MG/ML IV BOLUS
INTRAVENOUS | Status: AC
  Filled 2021-09-12: qty 20

## 2021-09-12 MED ORDER — FENTANYL CITRATE (PF) 100 MCG/2ML IJ SOLN: 25.0000 ug | INTRAMUSCULAR | Status: DC | PRN

## 2021-09-12 MED ORDER — GLYCOPYRROLATE PF 0.2 MG/ML IJ SOSY
PREFILLED_SYRINGE | INTRAMUSCULAR | Status: AC
  Filled 2021-09-12: qty 1

## 2021-09-12 MED ORDER — ACETAMINOPHEN 500 MG PO TABS
1000.0000 mg | ORAL_TABLET | Freq: Once | ORAL | Status: AC | PRN
  Administered 2021-09-12: 1000 mg via ORAL

## 2021-09-12 MED ORDER — DEXMEDETOMIDINE (PRECEDEX) IN NS 20 MCG/5ML (4 MCG/ML) IV SYRINGE
PREFILLED_SYRINGE | INTRAVENOUS | Status: DC | PRN
  Administered 2021-09-12: 12 ug via INTRAVENOUS

## 2021-09-12 MED ORDER — ONDANSETRON HCL 4 MG/2ML IJ SOLN: 4.0000 mg | Freq: Four times a day (QID) | INTRAMUSCULAR | Status: DC | PRN

## 2021-09-12 MED ORDER — SUGAMMADEX SODIUM 200 MG/2ML IV SOLN
INTRAVENOUS | Status: DC | PRN
  Administered 2021-09-12: 200 mg via INTRAVENOUS

## 2021-09-12 MED ORDER — OXYCODONE HCL 5 MG PO TABS: 5.0000 mg | ORAL_TABLET | Freq: Once | ORAL | Status: DC | PRN

## 2021-09-12 MED ORDER — ACETAMINOPHEN 500 MG PO TABS
1000.0000 mg | ORAL_TABLET | ORAL | Status: AC
  Administered 2021-09-12: 1000 mg via ORAL

## 2021-09-12 MED ORDER — ACETAMINOPHEN 500 MG PO TABS
ORAL_TABLET | ORAL | Status: AC
  Filled 2021-09-12: qty 2

## 2021-09-12 MED ORDER — MIDAZOLAM HCL 5 MG/5ML IJ SOLN
INTRAMUSCULAR | Status: DC | PRN
  Administered 2021-09-12: 2 mg via INTRAVENOUS

## 2021-09-12 MED ORDER — ONDANSETRON HCL 4 MG/2ML IJ SOLN
INTRAMUSCULAR | Status: AC
  Filled 2021-09-12: qty 2

## 2021-09-12 MED ORDER — ACETAMINOPHEN 10 MG/ML IV SOLN: 1000.0000 mg | Freq: Once | INTRAVENOUS | Status: DC | PRN

## 2021-09-12 MED ORDER — PHENAZOPYRIDINE HCL 100 MG PO TABS
200.0000 mg | ORAL_TABLET | ORAL | Status: AC
  Administered 2021-09-12: 200 mg via ORAL

## 2021-09-12 MED ORDER — ACETAMINOPHEN 160 MG/5ML PO SOLN: 1000.0000 mg | Freq: Once | ORAL | Status: AC | PRN

## 2021-09-12 MED ORDER — EPHEDRINE SULFATE-NACL 50-0.9 MG/10ML-% IV SOSY
PREFILLED_SYRINGE | INTRAVENOUS | Status: DC | PRN
  Administered 2021-09-12: 5 mg via INTRAVENOUS

## 2021-09-12 MED ORDER — ONDANSETRON HCL 4 MG PO TABS: 4.0000 mg | ORAL_TABLET | Freq: Four times a day (QID) | ORAL | Status: DC | PRN

## 2021-09-12 MED ORDER — ALBUMIN HUMAN 5 % IV SOLN: INTRAVENOUS | Status: DC | PRN

## 2021-09-12 MED ORDER — LACTATED RINGERS IV SOLN
INTRAVENOUS | Status: DC
  Administered 2021-09-12: 1 mL via INTRAVENOUS

## 2021-09-12 MED ORDER — MIDAZOLAM HCL 2 MG/2ML IJ SOLN
INTRAMUSCULAR | Status: AC
  Filled 2021-09-12: qty 2

## 2021-09-12 MED ORDER — PHENYLEPHRINE 40 MCG/ML (10ML) SYRINGE FOR IV PUSH (FOR BLOOD PRESSURE SUPPORT)
PREFILLED_SYRINGE | INTRAVENOUS | Status: DC | PRN
  Administered 2021-09-12 (×2): 40 ug via INTRAVENOUS
  Administered 2021-09-12: 80 ug via INTRAVENOUS
  Administered 2021-09-12: 40 ug via INTRAVENOUS
  Administered 2021-09-12: 80 ug via INTRAVENOUS
  Administered 2021-09-12: 40 ug via INTRAVENOUS
  Administered 2021-09-12: 120 ug via INTRAVENOUS
  Administered 2021-09-12 (×2): 80 ug via INTRAVENOUS

## 2021-09-12 MED ORDER — POVIDONE-IODINE 10 % EX SWAB: 2.0000 "application " | Freq: Once | CUTANEOUS | Status: DC

## 2021-09-12 MED ORDER — ONDANSETRON HCL 4 MG/2ML IJ SOLN
INTRAMUSCULAR | Status: DC | PRN
  Administered 2021-09-12: 4 mg via INTRAVENOUS

## 2021-09-12 MED ORDER — GABAPENTIN 300 MG PO CAPS
ORAL_CAPSULE | ORAL | Status: AC
  Filled 2021-09-12: qty 1

## 2021-09-12 MED ORDER — DEXMEDETOMIDINE (PRECEDEX) IN NS 20 MCG/5ML (4 MCG/ML) IV SYRINGE
PREFILLED_SYRINGE | INTRAVENOUS | Status: AC
  Filled 2021-09-12: qty 5

## 2021-09-12 MED ORDER — SCOPOLAMINE 1 MG/3DAYS TD PT72
MEDICATED_PATCH | TRANSDERMAL | Status: AC
  Filled 2021-09-12: qty 1

## 2021-09-12 MED ORDER — KETOROLAC TROMETHAMINE 30 MG/ML IJ SOLN
INTRAMUSCULAR | Status: DC | PRN
  Administered 2021-09-12: 30 mg via INTRAVENOUS

## 2021-09-12 MED ORDER — EPHEDRINE 5 MG/ML INJ
INTRAVENOUS | Status: AC
  Filled 2021-09-12: qty 5

## 2021-09-12 MED ORDER — OXYCODONE HCL 5 MG/5ML PO SOLN: 5.0000 mg | Freq: Once | ORAL | Status: DC | PRN

## 2021-09-12 MED ORDER — SODIUM CHLORIDE 0.9 % IR SOLN
Status: DC | PRN
Start: 2021-09-12 — End: 2021-09-12
  Administered 2021-09-12: 1000 mL
  Administered 2021-09-12: 1000 mL via INTRAVESICAL

## 2021-09-12 MED ORDER — OXYCODONE HCL 5 MG PO TABS: 5.0000 mg | ORAL_TABLET | ORAL | Status: DC | PRN

## 2021-09-12 MED ORDER — FENTANYL CITRATE (PF) 100 MCG/2ML IJ SOLN
INTRAMUSCULAR | Status: DC | PRN
  Administered 2021-09-12: 50 ug via INTRAVENOUS
  Administered 2021-09-12: 150 ug via INTRAVENOUS
  Administered 2021-09-12 (×2): 50 ug via INTRAVENOUS

## 2021-09-12 MED ORDER — PROPOFOL 10 MG/ML IV BOLUS
INTRAVENOUS | Status: DC | PRN
  Administered 2021-09-12: 200 mg via INTRAVENOUS

## 2021-09-12 MED ORDER — GLYCOPYRROLATE PF 0.2 MG/ML IJ SOSY
PREFILLED_SYRINGE | INTRAMUSCULAR | Status: DC | PRN
  Administered 2021-09-12: .2 mg via INTRAVENOUS

## 2021-09-12 SURGICAL SUPPLY — 95 items
ADH SKN CLS APL DERMABOND .7 (GAUZE/BANDAGES/DRESSINGS) ×1
AGENT HMST KT MTR STRL THRMB (HEMOSTASIS)
APL ESCP 34 STRL LF DISP (HEMOSTASIS)
APL PRP STRL LF DISP 70% ISPRP (MISCELLANEOUS) ×1
APL SRG 38 LTWT LNG FL B (MISCELLANEOUS) ×1
APPLICATOR ARISTA FLEXITIP XL (MISCELLANEOUS) ×1 IMPLANT
APPLICATOR SURGIFLO ENDO (HEMOSTASIS) IMPLANT
BLADE CLIPPER SENSICLIP SURGIC (BLADE) ×1 IMPLANT
BLADE SURG 15 STRL LF DISP TIS (BLADE) ×1 IMPLANT
BLADE SURG 15 STRL SS (BLADE) ×2
CATH FOLEY 3WAY  5CC 16FR (CATHETERS) ×2
CATH FOLEY 3WAY 5CC 16FR (CATHETERS) ×1 IMPLANT
CHLORAPREP W/TINT 26 (MISCELLANEOUS) ×2 IMPLANT
COVER BACK TABLE 60X90IN (DRAPES) ×2 IMPLANT
COVER TIP SHEARS 8 DVNC (MISCELLANEOUS) ×1 IMPLANT
COVER TIP SHEARS 8MM DA VINCI (MISCELLANEOUS) ×2
DECANTER SPIKE VIAL GLASS SM (MISCELLANEOUS) ×4 IMPLANT
DEFOGGER SCOPE WARMER CLEARIFY (MISCELLANEOUS) ×2 IMPLANT
DERMABOND ADVANCED (GAUZE/BANDAGES/DRESSINGS) ×1
DERMABOND ADVANCED .7 DNX12 (GAUZE/BANDAGES/DRESSINGS) ×1 IMPLANT
DEVICE CAPIO SLIM SINGLE (INSTRUMENTS) IMPLANT
DILATOR CANAL MILEX (MISCELLANEOUS) ×1 IMPLANT
DRAPE ARM DVNC X/XI (DISPOSABLE) ×4 IMPLANT
DRAPE COLUMN DVNC XI (DISPOSABLE) ×1 IMPLANT
DRAPE DA VINCI XI ARM (DISPOSABLE) ×8
DRAPE DA VINCI XI COLUMN (DISPOSABLE) ×2
DRAPE SHEET LG 3/4 BI-LAMINATE (DRAPES) IMPLANT
DRAPE UTILITY XL STRL (DRAPES) ×2 IMPLANT
DURAPREP 26ML APPLICATOR (WOUND CARE) ×2 IMPLANT
ELECT REM PT RETURN 9FT ADLT (ELECTROSURGICAL) ×2
ELECTRODE REM PT RTRN 9FT ADLT (ELECTROSURGICAL) ×1 IMPLANT
GAUZE 4X4 16PLY ~~LOC~~+RFID DBL (SPONGE) ×4 IMPLANT
GLOVE SURG ENC MOIS LTX SZ6 (GLOVE) ×8 IMPLANT
GLOVE SURG POLYISO LF SZ7 (GLOVE) ×1 IMPLANT
GLOVE SURG UNDER POLY LF SZ6.5 (GLOVE) ×10 IMPLANT
GLOVE SURG UNDER POLY LF SZ7 (GLOVE) ×4 IMPLANT
GOWN STRL REUS W/TWL LRG LVL3 (GOWN DISPOSABLE) ×3 IMPLANT
HEMOSTAT ARISTA ABSORB 3G PWDR (HEMOSTASIS) ×1 IMPLANT
HIBICLENS CHG 4% 4OZ BTL (MISCELLANEOUS) ×2 IMPLANT
HOLDER FOLEY CATH W/STRAP (MISCELLANEOUS) ×2 IMPLANT
IRRIG SUCT STRYKERFLOW 2 WTIP (MISCELLANEOUS) ×2
IRRIGATION SUCT STRKRFLW 2 WTP (MISCELLANEOUS) ×1 IMPLANT
KIT TURNOVER CYSTO (KITS) ×2 IMPLANT
LEGGING LITHOTOMY PAIR STRL (DRAPES) ×2 IMPLANT
MANIFOLD NEPTUNE II (INSTRUMENTS) ×2 IMPLANT
MANIPULATOR ADVINCU DEL 2.5 PL (MISCELLANEOUS) ×1 IMPLANT
MANIPULATOR ADVINCU DEL 3.0 PL (MISCELLANEOUS) ×1 IMPLANT
MANIPULATOR ADVINCU DEL 3.5 PL (MISCELLANEOUS) IMPLANT
MANIPULATOR ADVINCU DEL 4.0 PL (MISCELLANEOUS) IMPLANT
MESH VERTESSA LITE -Y 2X4X3 (Mesh General) ×2 IMPLANT
NEEDLE HYPO 22GX1.5 SAFETY (NEEDLE) ×2 IMPLANT
NEEDLE INSUFFLATION 120MM (ENDOMECHANICALS) ×2 IMPLANT
NS IRRIG 1000ML POUR BTL (IV SOLUTION) ×2 IMPLANT
OBTURATOR OPTICAL STANDARD 8MM (TROCAR) ×2
OBTURATOR OPTICAL STND 8 DVNC (TROCAR) ×1
OBTURATOR OPTICALSTD 8 DVNC (TROCAR) ×1 IMPLANT
PACK CYSTO (CUSTOM PROCEDURE TRAY) ×2 IMPLANT
PACK ROBOT WH (CUSTOM PROCEDURE TRAY) ×2 IMPLANT
PACK ROBOTIC GOWN (GOWN DISPOSABLE) ×2 IMPLANT
PACK TRENDGUARD 450 HYBRID PRO (MISCELLANEOUS) IMPLANT
PACK VAGINAL WOMENS (CUSTOM PROCEDURE TRAY) ×2 IMPLANT
PAD OB MATERNITY 4.3X12.25 (PERSONAL CARE ITEMS) ×2 IMPLANT
PAD POSITIONING PINK XL (MISCELLANEOUS) ×2 IMPLANT
PAD PREP 24X48 CUFFED NSTRL (MISCELLANEOUS) ×2 IMPLANT
POUCH LAPAROSCOPIC INSTRUMENT (MISCELLANEOUS) ×1 IMPLANT
PROTECTOR NERVE ULNAR (MISCELLANEOUS) ×4 IMPLANT
RETRACTOR LONE STAR DISPOSABLE (INSTRUMENTS) ×2 IMPLANT
RETRACTOR STAY HOOK 5MM (MISCELLANEOUS) ×2 IMPLANT
SEAL CANN UNIV 5-8 DVNC XI (MISCELLANEOUS) ×4 IMPLANT
SEAL XI 5MM-8MM UNIVERSAL (MISCELLANEOUS) ×8
SET IRRIG Y TYPE TUR BLADDER L (SET/KITS/TRAYS/PACK) ×2 IMPLANT
SET TUBE SMOKE EVAC HIGH FLOW (TUBING) ×2 IMPLANT
SPONGE T-LAP 4X18 ~~LOC~~+RFID (SPONGE) ×2 IMPLANT
SUCTION FRAZIER HANDLE 10FR (MISCELLANEOUS) ×2
SUCTION TUBE FRAZIER 10FR DISP (MISCELLANEOUS) ×1 IMPLANT
SURGIFLO W/THROMBIN 8M KIT (HEMOSTASIS) IMPLANT
SUT ABS MONO DBL WITH NDL 48IN (SUTURE) IMPLANT
SUT CV-0 GORETEX TFX25 36 (SUTURE) IMPLANT
SUT GORETEX NAB #0 THX26 36IN (SUTURE) ×1 IMPLANT
SUT MNCRL AB 4-0 PS2 18 (SUTURE) ×2 IMPLANT
SUT MON AB 2-0 SH 27 (SUTURE) ×2 IMPLANT
SUT VIC AB 0 CT1 27 (SUTURE)
SUT VIC AB 0 CT1 27XBRD ANTBC (SUTURE) IMPLANT
SUT VIC AB 2-0 SH 27 (SUTURE) ×4
SUT VIC AB 2-0 SH 27XBRD (SUTURE) ×1 IMPLANT
SUT VICRYL 2-0 SH 8X27 (SUTURE) IMPLANT
SUT VLOC 180 0 9IN  GS21 (SUTURE) ×2
SUT VLOC 180 0 9IN GS21 (SUTURE) IMPLANT
SUT VLOC 180 2-0 9IN GS21 (SUTURE) ×5 IMPLANT
SYR BULB EAR ULCER 3OZ GRN STR (SYRINGE) ×2 IMPLANT
SYS SLING ADV FIT BLUE TRNSVAG (Sling) ×1 IMPLANT
TOWEL OR 17X26 10 PK STRL BLUE (TOWEL DISPOSABLE) ×4 IMPLANT
TRAY FOLEY W/BAG SLVR 14FR (SET/KITS/TRAYS/PACK) ×2 IMPLANT
TRENDGUARD 450 HYBRID PRO PACK (MISCELLANEOUS)
TROCAR XCEL NON BLADE 8MM B8LT (ENDOMECHANICALS) ×2 IMPLANT

## 2021-09-12 NOTE — Op Note (Signed)
Operative Note  Preoperative Diagnosis: anterior vaginal prolapse, uterovaginal prolapse, incomplete, and stress urinary incontinence  Postoperative Diagnosis: same  Procedures performed:  Robotic assisted total laparoscopic hysterectomy with bilateral salpingectomy, sacrocolpopexy (vertessa lite y mesh), midurethral sling (advantage fit sling), cystoscopy  Implants:  Implant Name Type Inv. Item Serial No. Manufacturer Lot No. LRB No. Used Action  MESH Valli Glance [id number] Mesh [employer] [place] [id number] N/A 1 Implanted  SYS SLING ADV FIT BLUE TRNSVAG - [id number] Sling SYS SLING ADV FIT BLUE TRNSVAG  [employer] [place] [id number] N/A 1 Implanted    Attending Surgeon: Sherlene Shams, MD  Anesthesia: General endotracheal  Findings: 1. Normal appearing uterus, fallopian tube and ovaries bilaterally  2. On vaginal exam, stage II pelvic organ prolapse noted  3. On cystoscopy, normal bladder and urethra without injury, lesion or foreign body. Brisk bilateral ureteral efflux noted.     Specimens:  ID Type Source Tests Collected by Time Destination  1 : UTERUS CERVIX AND BILATERAL TUBES Tissue PATH Soft tissue SURGICAL PATHOLOGY Jaquita Folds, MD 09/12/2021 [phone]     Estimated blood loss: 50 mL  IV fluids: 600 mL  Urine output: 841 mL  Complications: none  Procedure in Detail:  After informed consent was obtained, the patient was taken to the operating room, where general anesthesia was induced and found to be adequate. She was placed in dorsolithotomy position in yellowfin stirrups. Her hips were noted not to be hyperflexed or hyperextended. Her arms were padded with gel pads and tucked to her sides. Her hands were surrounded by foam. A padded strap was placed across her chest with foam between the pad and her skin. She was noted to be appropriately positioned with all pressure points well padded and off tension. A tilt test showed no  slippage. She was prepped and draped in the usual sterile fashion. A uterine manipulator was placed in the uterus after sounding to 7 cm, an appropriately sized Koh ring was placed around the cervix, and a pneumo-occluder balloon was positioned in the vagina for later use. The cervix had to be dilated with placement of the manipulator due to intrauterine scar tissue. The uterine fundus was perforated with placement, but this did not prevent correct placement of the manipulator. A sterile Foley catheter was inserted.   0.25% plain Marcaine was injected in the supraumbilical area and an 8 mm supraumbilical skin incision was made with the scalpel.  A Veress needle was inserted into the incision, CO2 insufflation was started, a low opening pressure was noted, and pneumoperitoneum was obtained. The Veress needle was removed and a 75mm robotic trocar was placed. The robotic camera was inserted and intraperitoneal placement was confirmed. Survey of the abdomen and pelvis revealed the findings as noted above. The sacrum appeared to be free of any adhesive disease. After determining placement for the other ports, Local anesthetic was injected at each site and two 8 mm incisions were made for robotic ports at 10 cm lateral to and at the level of the umbilical port. Two additional 8 mm incisions were made 10 cm lateral to these and 30 degrees down followed by 8 mm robotic ports - the right side for an assistant port. All trocars were placed sequentially under direct visualization of the camera. The patient was placed in Trendelenburg. The robot was docked on the patient's left side. Monopolar endoshears were placed in the right arm, a [place] bipolar grasper was placed in the  2nd arm of the patient's left side, and a Tip up grasper was placed in the 3rd arm on the patient's left side.   Attention was then turned to the robotic hysterectomy and salpingectomy. The right fallopian tube was grasped at the fimbriated end. The  mesosalpinx was then cauterized and incised up to the fundus. The right round ligament was grasped, cauterized, and transected with electrocautery. The anterior and posterior leaves of the broad ligament were taken down with cautery and sharp dissection. The uterine artery was skeletonized and the bladder flap was created on the right side with a combination of electrosurgery and sharp dissection. The KOH ring was identified. The right uterine artery was clamped, cauterized, and transected. In a similar fashion, the left side was taken down. Further sharp dissection with combination of cautery was performed to further develop the bladder flap. At this point, the KOH ring was completely hugging the cervix. The pneumo-occluder balloon in the vagina was inflated to maintain pneumoperitoneum. A colpotomy was performed with electrosurgical cutting current and the uterus and cervix were completely amputated from the vagina. The specimen was delivered through the vagina. The posterior portion of the vaginal cuff was then grasped and pulled up to maintain pneumoperitoneum. The pneumo-occluder balloon was then replaced in the vagina. The right hand instrument was changed to a suture-cut needle driver. The vaginal cuff was then closed using a 0 V-lock suture.    The right hand instrument was replaced with monopolar endoshears. Attention was then turned to the sacral promontory. The peritoneum overlying the sacral promontory was tented up, dissected sharply with monopolar scissors and electrosurgery using layer by layer technique. The peritoneal incision was extended down to the posterior cul-de-sac. This was performed with care to avoid the ureter on the right side and the sigmoid colon and its mesentary on the left side. With a lucite probe in the vagina, the anterior vaginal dissection was then performed with sharp dissection and electrosurgery. The posterior vaginal dissection was then performed with sharp dissection and  electrosurgery in order to dissect the rectum away from the posterior vagina. Attention was then returned to the sacral promontory, which was palpated with an assistant grasper to confirm correct location.  The overlying areolar and adipose tissue were taken down until the anterior longitudinal sacral ligament was identified. Small vessels were cauterized along the way to obtain excellent hemostasis. A "Y" mesh was then inserted into the abdomen after trimming to appropriate size. With the probe in the vagina, the anterior leaf of the Y mesh was affixed to the anterior portion of the vagina using a 2-0 v-lock suture in a spiral pattern to distribute the suture evenly across the surface of the anterior mesh leaf. In a similar fashion, the posterior leaf of the Y mesh was attached to the posterior surface of the vagina with 2-0 v-loc suture. The distal end of the mesh was then brought to overlie the sacrum. The correct amount of tension was determined in order to elevate the vagina, but not put the mesh under tension. The distal end of the mesh was then affixed to the anterior longitudinal sacral ligament using two interrupted transverse stitches of CV2 Gortex. The excess distal mesh was then cut and removed. The peritoneum was reapproximated over the mesh using 2-0 monocryl. The bladder flap was incorporated to completely retroperitonealize the mesh. The site of the right tube was noted to be oozing and this was cauterized with the bipolar. Good hemostasis was noted. Arista was placed  on all surgical sites. All pedicles were carefully inspected and noted to be hemostatic as the CO2 gas was deflated. All instruments were removed from the patient's abdomen.   The Foley catheter was removed.  A 70-degree cystoscope was introduced, and 360-degree inspection revealed no injury, lesion or foreign body in the bladder. Brisk bilateral ureteral efflux was noted with the assistance of pyridium.  The bladder was drained and  the cystoscope was removed.  The Foley catheter was replaced.  The robot was undocked. The CO2 gas was removed and the ports were removed.  The skin incisions were closed with subcutaneous stitches of 4-0 Monocryl and covered with skin glue.    A lonestar self-retraining retractor was placed with 4 stay hooks. The mid urethral area was located on the anterior vaginal wall.  Two Allis clamps were placed at the level of the midurethra. 1% lidocaine with epinephrine was injected into the vaginal mucosa. A vertical incision was made between the two clamps using a 15-blade scalpel.  Using sharp dissection, Metzenbaum scissors were used to make a periurethral tunnel from the vaginal incision towards the pubic rami bilaterally for the future sling tracts. The bladder was ensured to be empty. The trocar and attached sling were introduced into the right side of the periurethral vaginal incision, just inferior to the pubic symphysis on the right side. The trocar was guided through the endopelvic fascia and directly vertically.  While hugging the cephalad surface of the pubic bone, the trocar was guided out through the abdomen 2 fingerbreadths lateral to midline at the level of the pubic symphysis on the ipsilateral side. The trocar was placed on the left side in a similar fashion.  A 70-degree cystoscope was introduced, and 360-degree inspection revealed no trauma or trocars in the bladder, with brisk bilateral ureteral efflux.  The bladder was drained and the cystoscope was removed.  The Foley catheter was reinserted.  The sling was brought to lie beneath the mid-urethra.  A needle driver was placed behind the sling to ensure no tension.   The plastic sheath was removed from the sling and the distal ends of the sling were trimmed just below the level of the skin incisions. Hemostasis was noted.  Tension-free positioning of the sling was confirmed. Vaginal inspection revealed no vaginotomy or sling perforations of the  mucosa. The vaginal mucosal edges were reapproximated using 2-0 Vicryl.    Hemostasis was again noted.  The suprapubic sling incisions were closed with Dermabond. The patient tolerated the procedure well.  She was awakened from anesthesia and transferred to the recovery room in stable condition. All needle and sponge counts were correct x 2.    Jaquita Folds, MD

## 2021-09-12 NOTE — Transfer of Care (Signed)
Immediate Anesthesia Transfer of Care Note  Patient: Regina Mcconnell  Procedure(s) Performed: XI ROBOTIC ASSISTED TOTAL HYSTERECTOMY with bilateral salpingectomy XI ROBOTIC ASSISTED LAPAROSCOPIC SACROCOLPOPEXY TRANSVAGINAL TAPE (TVT) PROCEDURE CYSTOSCOPY  Patient Location: PACU  Anesthesia Type:General  Level of Consciousness: awake, alert , oriented and patient cooperative  Airway & Oxygen Therapy: Patient Spontanous Breathing  Post-op Assessment: Report given to RN and Post -op Vital signs reviewed and stable  Post vital signs: Reviewed and stable  Last Vitals:  Vitals Value Taken Time  BP 104/75 09/12/21 1118  Temp 36.4 C 09/12/21 1118  Pulse 83 09/12/21 1124  Resp 13 09/12/21 1124  SpO2 98 % 09/12/21 1124  Vitals shown include unvalidated device data.  Last Pain:  Vitals:   09/12/21 0602  TempSrc: Oral  PainSc: 0-No pain      Patients Stated Pain Goal: 5 ([id number])  Complications: No notable events documented.

## 2021-09-12 NOTE — Anesthesia Procedure Notes (Signed)
Procedure Name: Intubation Date/Time: 09/12/2021 7:44 AM Performed by: Rogers Blocker, CRNA Pre-anesthesia Checklist: Patient identified, Emergency Drugs available, Suction available and Patient being monitored Patient Re-evaluated:Patient Re-evaluated prior to induction Oxygen Delivery Method: Circle System Utilized Preoxygenation: Pre-oxygenation with 100% oxygen Induction Type: IV induction Ventilation: Mask ventilation without difficulty Laryngoscope Size: Mac and 3 Grade View: Grade I Tube type: Oral Tube size: 7.0 mm Number of attempts: 1 Airway Equipment and Method: Stylet and Bite block Placement Confirmation: ETT inserted through vocal cords under direct vision, positive ETCO2 and breath sounds checked- equal and bilateral Secured at: 21 cm Tube secured with: Tape Dental Injury: Teeth and Oropharynx as per pre-operative assessment

## 2021-09-12 NOTE — Discharge Instructions (Addendum)

## 2021-09-12 NOTE — Interval H&P Note (Signed)
History and Physical Interval Note:  09/12/2021 7:17 AM  Regina Mcconnell  has presented today for surgery, with the diagnosis of anterior vaginal prolapse; uterovaginal prolapse; stress urinary incontinence;.  The various methods of treatment have been discussed with the patient and family. After consideration of risks, benefits and other options for treatment, the patient has consented to  Procedure(s) with comments: [place] with bilateral salpingectomy (N/A) - total time needed 3.5hrs XI ROBOTIC ASSISTED LAPAROSCOPIC SACROCOLPOPEXY (N/A) TRANSVAGINAL TAPE (TVT) PROCEDURE (N/A) CYSTOSCOPY (N/A) as a surgical intervention.    Vitals:   09/12/21 0602  BP: 121/89  Pulse: 80  Resp: 16  Temp: 98.4 F (36.9 C)  SpO2: 98%    Gen: NAD Lungs: normal respirations Abd: soft, nontender  The patient's history has been reviewed, patient examined, no change in status, stable for surgery.  I have reviewed the patient's chart and labs.  Questions were answered to the patient's satisfaction.     Jaquita Folds

## 2021-09-12 NOTE — Anesthesia Preprocedure Evaluation (Addendum)
Anesthesia Evaluation  Patient identified by MRN, date of birth, ID band Patient awake    Reviewed: Allergy & Precautions, NPO status , Patient's Chart, lab work & pertinent test results  History of Anesthesia Complications (+) PONV and history of anesthetic complications  Airway Mallampati: I  TM Distance: >3 FB Neck ROM: Full    Dental  (+) Dental Advisory Given, Teeth Intact   Pulmonary neg shortness of breath, neg sleep apnea, neg COPD, neg recent URI,    breath sounds clear to auscultation       Cardiovascular negative cardio ROS   Rhythm:Regular     Neuro/Psych negative neurological ROS  negative psych ROS   GI/Hepatic negative GI ROS, Neg liver ROS,   Endo/Other  negative endocrine ROS  Renal/GU negative Renal ROS     Musculoskeletal negative musculoskeletal ROS (+)   Abdominal   Peds  Hematology negative hematology ROS (+)   Anesthesia Other Findings   Reproductive/Obstetrics                            Anesthesia Physical Anesthesia Plan  ASA: 1  Anesthesia Plan: General   Post-op Pain Management: Toradol IV (intra-op) and Tylenol PO (pre-op)   Induction: Intravenous  PONV Risk Score and Plan: 4 or greater and Ondansetron, Dexamethasone, Propofol infusion, Midazolam and Scopolamine patch - Pre-op  Airway Management Planned: Oral ETT  Additional Equipment: None  Intra-op Plan:   Post-operative Plan: Extubation in OR  Informed Consent: I have reviewed the patients History and Physical, chart, labs and discussed the procedure including the risks, benefits and alternatives for the proposed anesthesia with the patient or authorized representative who has indicated his/her understanding and acceptance.     Dental advisory given  Plan Discussed with: CRNA and Anesthesiologist  Anesthesia Plan Comments:         Anesthesia Quick Evaluation

## 2021-09-12 NOTE — Telephone Encounter (Signed)
Regina Mcconnell underwent robotic TLH, sacrocolpopexy, sling and cystoscopy on 09/12/21.   She passed her voiding trial.  265ml was backfilled into the bladder Voided 257ml  PVR by bladder scan was 35ml.   She was discharged without a catheter. Please call her for a routine post op check. Thanks!  Jaquita Folds, MD

## 2021-09-13 ENCOUNTER — Encounter: Payer: Self-pay | Admitting: *Deleted

## 2021-09-13 LAB — SURGICAL PATHOLOGY

## 2021-09-13 NOTE — Anesthesia Postprocedure Evaluation (Signed)
Anesthesia Post Note  Patient: Regina Mcconnell  Procedure(s) Performed: XI ROBOTIC ASSISTED TOTAL HYSTERECTOMY with bilateral salpingectomy XI ROBOTIC ASSISTED LAPAROSCOPIC SACROCOLPOPEXY TRANSVAGINAL TAPE (TVT) PROCEDURE CYSTOSCOPY     Patient location during evaluation: PACU Anesthesia Type: General Level of consciousness: awake and alert Pain management: pain level controlled Vital Signs Assessment: post-procedure vital signs reviewed and stable Respiratory status: spontaneous breathing, nonlabored ventilation, respiratory function stable and patient connected to nasal cannula oxygen Cardiovascular status: blood pressure returned to baseline and stable Postop Assessment: no apparent nausea or vomiting Anesthetic complications: no   No notable events documented.  Last Vitals:  Vitals:   09/12/21 1344 09/12/21 1645  BP: 124/81 105/65  Pulse: 68 65  Resp: 16 16  Temp: (!) 36.4 C 36.7 C  SpO2: 98% 98%    Last Pain:  Vitals:   09/12/21 1645  TempSrc:   PainSc: 0-No pain                 Delaynie Stetzer

## 2021-09-13 NOTE — Telephone Encounter (Signed)
Post- Op Call  Regina Mcconnell underwent robotic TLH, sacrocolpopexy, sling, and cysto on 09/12/21 with Dr Wannetta Sender. The patient reports that her pain is controlled. She is taking ibuprofen and tylenol. She denies vaginal bleeding. She has not had a bowel movement and is taking miralax for a bowel regimen. She was discharged without a catheter. Pt states she is voiding well.  Advised to call with any problems or concerns.  Blenda Nicely, RN

## 2021-09-14 ENCOUNTER — Encounter (HOSPITAL_BASED_OUTPATIENT_CLINIC_OR_DEPARTMENT_OTHER): Payer: Self-pay | Admitting: Obstetrics and Gynecology

## 2021-10-25 ENCOUNTER — Ambulatory Visit (INDEPENDENT_AMBULATORY_CARE_PROVIDER_SITE_OTHER): Payer: No Typology Code available for payment source | Admitting: Obstetrics and Gynecology

## 2021-10-25 ENCOUNTER — Other Ambulatory Visit: Payer: Self-pay

## 2021-10-25 ENCOUNTER — Encounter: Payer: Self-pay | Admitting: Obstetrics and Gynecology

## 2021-10-25 VITALS — BP 132/81 | HR 76

## 2021-10-25 DIAGNOSIS — Z9889 Other specified postprocedural states: Secondary | ICD-10-CM

## 2021-10-25 NOTE — Progress Notes (Signed)
[facility]  Date of Visit: 10/25/2021  History of Present Illness: Ms. Wardrop is a 51 y.o. female scheduled today for a post-operative visit.   Surgery: s/p Robotic assisted total laparoscopic hysterectomy with bilateral salpingectomy, sacrocolpopexy (vertessa lite y mesh), midurethral sling (advantage fit sling), cystoscopy on 09/12/21  She passed her postoperative void trial.   Postoperative course has been uncomplicated.   Today she reports she is feeling well.  UTI in the last 6 weeks? No  Pain? No  She has returned to her normal activity (except for postop restrictions) Vaginal bulge? No  Stress incontinence: occasionally will have small amount of leakage with cough, but not like it was before Urgency/frequency: No  Urge incontinence: No  Voiding dysfunction: No  Bowel issues:  has been using the magnesium citrate.   Subjective Success: Do you usually have a bulge or something falling out that you can see or feel in the vaginal area? No  Retreatment Success: Any retreatment with surgery or pessary for any compartment? No   Pathology results: UTERUS, CERVIX, BILATERAL FALLOPIAN TUBES, HYSTERECTOMY AND  SALPINGECTOMY:  Benign inactive endometrium  Mild chronic cervicitis with squamous metaplasia  Benign fallopian tubes  Negative for malignancy  Medications: She has a current medication list which includes the following prescription(s): semaglutide (1 mg/dose).   Allergies: Patient has No Known Allergies.   Physical Exam: BP 132/81    Pulse 76   Abdomen: soft, non-tender, without masses or organomegaly Laparoscopic and suprapubic Incisions: healing well.  Pelvic Examination: Vagina: Incisions healing well. Sutures are not present at incision line and there is not granulation tissue. No tenderness along the anterior or posterior vagina. No apical tenderness. No pelvic masses. No visible or palpable mesh.  POP-Q: POP-Q  -3                                             Aa   -3                                           Ba  -9                                              C   4                                            Gh  3                                            Pb  9                                            tvl   -1  Ap  -1                                            Bp                                                 D    ---------------------------------------------------------  Assessment and Plan: No diagnosis found.  - Some posterior prolapse noted today- we discussed avoidance of straining with bowel movements. She will try oral magnesium supplementation daily.  - Pathology results were reviewed with the patient today and she verbalized understanding that the results were benign.  - Can resume regular activity including exercise and intercourse,  if desired.  - Discussed avoidance of heavy lifting and straining long term to reduce the risk of recurrence.   Follow up as needed  Jaquita Folds, MD

## 2021-10-25 NOTE — Patient Instructions (Signed)
For bowel movements try 400mg  magnesium daily or Calm Magnesium supplement.

## 2021-11-01 ENCOUNTER — Other Ambulatory Visit (HOSPITAL_BASED_OUTPATIENT_CLINIC_OR_DEPARTMENT_OTHER): Payer: Self-pay

## 2021-11-01 ENCOUNTER — Other Ambulatory Visit: Payer: Self-pay | Admitting: Physician Assistant

## 2021-11-01 MED ORDER — OZEMPIC (1 MG/DOSE) 4 MG/3ML ~~LOC~~ SOPN
1.0000 mg | PEN_INJECTOR | SUBCUTANEOUS | 1 refills | Status: DC
  Filled 2021-11-01: qty 9, 84d supply, fill #0

## 2021-11-01 MED ORDER — OZEMPIC (1 MG/DOSE) 4 MG/3ML ~~LOC~~ SOPN
1.0000 mg | PEN_INJECTOR | SUBCUTANEOUS | 0 refills | Status: DC
  Filled 2021-11-01: qty 9, 84d supply, fill #0

## 2021-11-01 NOTE — Progress Notes (Signed)
Refiled ozempic for weight loss.  ?

## 2021-12-26 ENCOUNTER — Other Ambulatory Visit (HOSPITAL_BASED_OUTPATIENT_CLINIC_OR_DEPARTMENT_OTHER): Payer: Self-pay

## 2021-12-26 ENCOUNTER — Ambulatory Visit (INDEPENDENT_AMBULATORY_CARE_PROVIDER_SITE_OTHER): Payer: No Typology Code available for payment source | Admitting: Sports Medicine

## 2021-12-26 DIAGNOSIS — M48061 Spinal stenosis, lumbar region without neurogenic claudication: Secondary | ICD-10-CM

## 2021-12-26 MED ORDER — KETOROLAC TROMETHAMINE 30 MG/ML IJ SOLN
30.0000 mg | Freq: Once | INTRAMUSCULAR | Status: AC
  Administered 2021-12-26: 30 mg via INTRAMUSCULAR

## 2021-12-26 MED ORDER — PREDNISONE 50 MG PO TABS
ORAL_TABLET | ORAL | 0 refills | Status: DC
  Filled 2021-12-26: qty 5, 5d supply, fill #0

## 2021-12-26 NOTE — Progress Notes (Signed)
? ? ?  Procedures performed today:   ? ?None. ? ?Independent interpretation of notes and tests performed by another provider:  ? ?I did personally review a CT abdomen pelvis, with close attention paid to the lumbar spine, when he does have a large disc protrusion at L4-L5 with ligamentum flavum hypertrophy contributing to moderate spinal stenosis. ? ?Brief History, Exam, Impression, and Recommendations:   ? ?Lumbar spinal stenosis ?Regina Mcconnell is a very pleasant 51 year old female, she has a history of acute low back pain, last treated in 2013 conservatively, more recently she was doing some yard work, bent over to trim a bush, and felt a sharp pain in the back, today she has severe pain, worse with standing, nothing radicular, no red flag symptoms. ?I did review a CT recently which does show disc protrusions L4-L5 and L5-S1, there is moderate stenosis of the central canal at L4-L5. ?We will do Toradol 30 intramuscular, steroids, formal physical therapy, baseline x-rays. ?Return to see me in 6 weeks, MRI for interventional planning if not better. ? ?Chronic process with exacerbation and pharmacologic intervention ? ?___________________________________________ ?Gwen Her. Dianah Field, M.D., ABFM., CAQSM. ?Primary Care and Sports Medicine ?[place] ? ?Adjunct Instructor of Family Medicine  ?[employer] ?

## 2021-12-26 NOTE — Addendum Note (Signed)
Addended by: Dema Severin on: 12/26/2021 09:01 AM ? ? Modules accepted: Orders ? ?

## 2021-12-26 NOTE — Assessment & Plan Note (Signed)
Regina Mcconnell is a very pleasant 51 year old female, she has a history of acute low back pain, last treated in 2013 conservatively, more recently she was doing some yard work, bent over to trim a bush, and felt a sharp pain in the back, today she has severe pain, worse with standing, nothing radicular, no red flag symptoms. ?I did review a CT recently which does show disc protrusions L4-L5 and L5-S1, there is moderate stenosis of the central canal at L4-L5. ?We will do Toradol 30 intramuscular, steroids, formal physical therapy, baseline x-rays. ?Return to see me in 6 weeks, MRI for interventional planning if not better. ?

## 2022-01-02 ENCOUNTER — Ambulatory Visit
Payer: No Typology Code available for payment source | Attending: Sports Medicine | Admitting: Rehabilitative and Restorative Service Providers"

## 2022-01-02 ENCOUNTER — Encounter: Payer: Self-pay | Admitting: Rehabilitative and Restorative Service Providers"

## 2022-01-02 DIAGNOSIS — M6281 Muscle weakness (generalized): Secondary | ICD-10-CM | POA: Diagnosis not present

## 2022-01-02 DIAGNOSIS — M545 Low back pain, unspecified: Secondary | ICD-10-CM | POA: Insufficient documentation

## 2022-01-02 DIAGNOSIS — M48061 Spinal stenosis, lumbar region without neurogenic claudication: Secondary | ICD-10-CM | POA: Insufficient documentation

## 2022-01-02 DIAGNOSIS — R293 Abnormal posture: Secondary | ICD-10-CM | POA: Insufficient documentation

## 2022-01-02 NOTE — Therapy (Signed)
[facility] [address] ?[place], [place], [address] ?Phone: [phone]   Fax:  [phone] ? ?Physical Therapy Evaluation ? ?Patient Details  ?Name: Regina Mcconnell ?MRN: [id number] ?Date of Birth: Feb 17, 1971 ?Referring Provider (PT): Dt Thekkekandam ? ? ?Encounter Date: 01/02/2022 ? ? PT End of Session - 01/02/22 1254   ? ? Visit Number 1   ? Number of Visits 12   ? Date for PT Re-Evaluation 02/13/22   ? ?  ?  ? ?  ? ? ?Past Medical History:  ?Diagnosis Date  ? Abnormal Pap smear of cervix 1995  ? --hx colposcopy w/bx and cryotherapy of cervix(paps normal since)  ? Heart murmur   ? as child and young child none since  ? History of COVID-19 03/2021  ? diarrhea x 3 days cough /fever x 2 days all symptoms resolved  ? Pneumonia   ? 03/2020   ? PONV (postoperative nausea and vomiting)   ? pt requests scopolamine patch dos  ? Urinary incontinence   ? especially with exercise  ? Wears glasses   ? or contacts  ? ? ?Past Surgical History:  ?Procedure Laterality Date  ? BLADDER SUSPENSION N/A 09/12/2021  ? Procedure: TRANSVAGINAL TAPE (TVT) PROCEDURE;  Surgeon: Jaquita Folds, MD;  Location: [facility];  Service: Gynecology;  Laterality: N/A;  ? BREAST REDUCTION SURGERY Bilateral 03/03/2020  ? Procedure: BREAST REDUCTION WITH LIPOSUCTION;  Surgeon: Wallace Going, DO;  Location: [facility];  Service: Plastics;  Laterality: Bilateral;  ? CYSTOSCOPY N/A 09/12/2021  ? Procedure: CYSTOSCOPY;  Surgeon: Jaquita Folds, MD;  Location: [facility];  Service: Gynecology;  Laterality: N/A;  ? DILATION AND CURETTAGE OF UTERUS    ? due to heavy cycles  ? ENDOMETRIAL ABLATION  08/29/2007  ? Dr. Quincy Simmonds  ? ROBOTIC ASSISTED LAPAROSCOPIC SACROCOLPOPEXY N/A 09/12/2021  ? Procedure: XI ROBOTIC ASSISTED LAPAROSCOPIC SACROCOLPOPEXY;  Surgeon: Jaquita Folds, MD;  Location: [facility];  Service: Gynecology;   Laterality: N/A;  ? ROBOTIC ASSISTED TOTAL HYSTERECTOMY N/A 09/12/2021  ? Procedure: XI ROBOTIC ASSISTED TOTAL HYSTERECTOMY with bilateral salpingectomy;  Surgeon: Jaquita Folds, MD;  Location[facility];  Service: Gynecology;  Laterality: N/A;  total time needed 3.5hrs  ? ? ?There were no vitals filed for this visit. ? ? ? Subjective Assessment - 01/02/22 0807   ? ? Subjective Ben forward to prune shrubs Saturday 12/25/21 and felt sharp pain making it diffucult to move. She has noted some improvement with meds and rest but has some continued pain on an intermittent basis mostly with bending forward.   ? Pertinent History hysterectomy 2/23, breast reduction ~ 3 years ago   ? Patient Stated Goals get rid of the LBP   ? Currently in Pain? No/denies   ? Pain Score 0-No pain   ? Pain Location Back   ? Pain Orientation Right;Left;Lower   ? Pain Descriptors / Indicators Tightness   ? Pain Type Acute pain   ? Pain Radiating Towards some into bilat LE's to feet when LBP started - resolved with meds   ? Pain Onset 1 to 4 weeks ago   ? Pain Frequency Intermittent   ? Aggravating Factors  bending forward; bent forward position and returning to stand; sitting prolonged periods of time   ? Pain Relieving Factors meds heat   ? ?  ?  ? ?  ? ? ? ? ?  [facility] PT Assessment - 01/02/22 0001   ? ?  ? Assessment  ? Medical Diagnosis LBP   ? Referring Provider (PT) Dt Thekkekandam   ? Onset Date/Surgical Date 12/25/21   ? Hand Dominance Right   ? Next MD Visit PRN   ? Prior Therapy here prior to breast reduction   ?  ? Precautions  ? Precautions None   ?  ? Restrictions  ? Weight Bearing Restrictions No   ?  ? Balance Screen  ? Has the patient fallen in the past 6 months No   ? Has the patient had a decrease in activity level because of a fear of falling?  No   ? Is the patient reluctant to leave their home because of a fear of falling?  No   ?  ? Home Environment  ? Living Environment Private residence   ? Living  Arrangements Spouse/significant other;Children   ?  ? Prior Function  ? Level of Independence Independent   ? Vocation Full time employment   ? 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  ? Observation/Other Assessments  ? Observations sitting with LE's crossed   ?  ? Sensation  ? Additional Comments WFL's per pt report   ?  ? Posture/Postural Control  ? Posture Comments flexed forward at hips/trunk fwd   ?  ? AROM  ? Overall AROM Comments hip mobility WFL's except hip extension limited Lt > Rt   ? Lumbar Flexion 65% pulling pain   ? Lumbar Extension 20% discomfort   ? Lumbar - Right Side Bend 75%   ? Lumbar - Left Side Bend 75%   ? Lumbar - Right Rotation 35%   ? Lumbar - Left Rotation 35%   ?  ? Flexibility  ? Hamstrings slightly tighter Lt compared to Rt - WFLs bilat   ? Quadriceps tight Lt > Rt   ? ITB WFL's   ? Piriformis tight Lt > Rt   ?  ? Palpation  ? Spinal mobility ypomobile lumbar spine with PA and lateral mobs   ? Palpation comment muscular tightness Lt > Rt psoas and hip flexors   ?  ? Special Tests  ? Other special tests (-) SLR   ? ?  ?  ? ?  ? ? ? ? ? ? ? ? ? ? ? ? ? ?Objective measurements completed on examination: See above findings.  ? ? ? ? ? [facility] PT Treatment/Exercise - 01/02/22 0001   ? ?  ? Self-Care  ? Self-Care ADL's;Other Self-Care Comments   ? ADL's sit to supine and supine to sit via sidelying   ? Other Self-Care Comments  myofacial ball release standing   ?  ? Lumbar Exercises: Stretches  ? Hip Flexor Stretch Right;Left;3 reps;30 seconds   ? Hip Flexor Stretch Limitations supine thomas and sitting   ? Standing Extension 1 rep   ? Standing Extension Limitations 2-3 sec discomfort - will gradually progress as tolerated   ? Press Ups 5 reps;5 seconds   ? Press Ups Limitations through partial range   ?  ? Lumbar Exercises: Supine  ? AB Set Limitations 4 part core 10 sec x 5   ?  ? Lumbar Exercises: Prone  ? Other Prone Lumbar Exercises glut set 5 sec x 5 reps   ? ?   ?  ? ?  ? ? ? ? ? ? ? ? ? ? PT Education - 01/02/22 0835   ? ?  Education Details HEP POC posture office ergonomics myofacial ball release standing   ? Person(s) Educated Patient   ? Methods Explanation;Demonstration;Tactile cues;Verbal cues;Handout   ? Comprehension Verbalized understanding;Returned demonstration;Verbal cues required;Tactile cues required   ? ?  ?  ? ?  ? ? ? ? ? ? PT Long Term Goals - 01/02/22 1314   ? ?  ? PT LONG TERM GOAL #1  ? Title The patient will be independent with HEP for mid/low back stabilization and postural stretching.   ? Time 6   ? Period Weeks   ? Status New   ? Target Date 02/13/22   ?  ? PT LONG TERM GOAL #2  ? Title The patient will report being able to perform recreational activities/ household chores including yard work without c/o low and mid back pain.   ? Time 6   ? Period Weeks   ? Status New   ? Target Date 02/13/22   ?  ? PT LONG TERM GOAL #3  ? Title Patient demonstrates improved upright posture with normal length through the hip flexors no longer standing with hip flexed posture   ? Time 6   ? Period Weeks   ? Status New   ? Target Date 02/13/22   ?  ? PT LONG TERM GOAL #4  ? Title Independent in [place]   ? Time 6   ? Period Weeks   ? Status New   ? Target Date 02/13/22   ?  ? PT LONG TERM GOAL #5  ? Title Improve functional limitation score to 73   ? Time 6   ? Period Weeks   ? Status New   ? Target Date 02/13/22   ? ?  ?  ? ?  ? ? ? ? ? ? ? ? ? Plan - 01/02/22 1254   ? ? Clinical Impression Statement Avneet presents with acute onset of LBP 12/25/21 while she was bent forward to trim shrubs. She was treated with predinisone dose pac with good resolution of most of the pain. She has intermittent pain with bending forward; prolonged sitting; some functional activities. Patient has limited trunk ROM/mobility; core weakness; tightness through hip flexors; muscular tightness to palpation; pain on an intermittent basis. She will benefit from PT to address problems identified.    ? Stability/Clinical Decision Making Stable/Uncomplicated   ? Clinical Decision Making Low   ? Rehab Potential Good   ? PT Frequency 1x / week   ? PT Duration 6 weeks   ? PT Treatment/Interventions ADLs/S

## 2022-01-02 NOTE — Patient Instructions (Signed)
Access Code: [email or web address] ?URL: [email or web address] ?Date: 01/02/2022 ?Prepared by: Gillermo Murdoch ? ?Exercises ?- Prone Press Up  - 2 x daily - 7 x weekly - 1 sets - 10 reps - 2-3 sec  hold ?- Standing Lumbar Extension  - 2 x daily - 7 x weekly - 1 sets - 2-3 reps - 2-3 sec  hold ?- Hip Flexor Stretch at Edge of Bed  - 2 x daily - 7 x weekly - 1 sets - 3 reps - 30 sec  hold ?- Seated Hip Flexor Stretch  - 2 x daily - 7 x weekly - 1 sets - 3 reps - 30 sec  hold ?- Prone Gluteal Sets  - 2 x daily - 7 x weekly - 1 sets - 10 reps - 10 sec  hold ?- Supine Transversus Abdominis Bracing with Pelvic Floor Contraction  - 2 x daily - 7 x weekly - 1 sets - 10 reps - 10sec  hold ? ?Patient Education ?- Biomedical scientist ?- Office Posture ?

## 2022-01-05 ENCOUNTER — Other Ambulatory Visit: Payer: Self-pay | Admitting: Physician Assistant

## 2022-01-05 DIAGNOSIS — Z1231 Encounter for screening mammogram for malignant neoplasm of breast: Secondary | ICD-10-CM

## 2022-01-06 ENCOUNTER — Other Ambulatory Visit: Payer: Self-pay | Admitting: Physician Assistant

## 2022-01-06 DIAGNOSIS — Z1211 Encounter for screening for malignant neoplasm of colon: Secondary | ICD-10-CM

## 2022-01-06 DIAGNOSIS — Z0000 Encounter for general adult medical examination without abnormal findings: Secondary | ICD-10-CM

## 2022-01-06 NOTE — Progress Notes (Signed)
Pt never called for colonoscopy. Replaced referral.  ?

## 2022-01-11 ENCOUNTER — Ambulatory Visit: Payer: No Typology Code available for payment source | Admitting: Rehabilitative and Restorative Service Providers"

## 2022-01-11 ENCOUNTER — Encounter: Payer: Self-pay | Admitting: Rehabilitative and Restorative Service Providers"

## 2022-01-11 DIAGNOSIS — M545 Low back pain, unspecified: Secondary | ICD-10-CM

## 2022-01-11 DIAGNOSIS — M6281 Muscle weakness (generalized): Secondary | ICD-10-CM

## 2022-01-11 DIAGNOSIS — M48061 Spinal stenosis, lumbar region without neurogenic claudication: Secondary | ICD-10-CM | POA: Diagnosis not present

## 2022-01-11 DIAGNOSIS — R293 Abnormal posture: Secondary | ICD-10-CM

## 2022-01-11 NOTE — Therapy (Signed)
[facility] ?[place] ?[place], [place], [address] ?Phone: [phone]   Fax:  [phone] ? ?Physical Therapy Treatment ? ?Patient Details  ?Name: Regina Mcconnell ?MRN: [id number] ?Date of Birth: 04/12/1971 ?Referring Provider (PT): Dt Thekkekandam ? ? ?Encounter Date: 01/11/2022 ? ? PT End of Session - 01/11/22 1410   ? ? Visit Number 2   ? Number of Visits 12   ? Date for PT Re-Evaluation 02/13/22   ? PT Start Time 1402   ? PT Stop Time 0623   ? PT Time Calculation (min) 43 min   ? Activity Tolerance Patient tolerated treatment well   ? ?  ?  ? ?  ? ? ?Past Medical History:  ?Diagnosis Date  ? Abnormal Pap smear of cervix 1995  ? --hx colposcopy w/bx and cryotherapy of cervix(paps normal since)  ? Heart murmur   ? as child and young child none since  ? History of COVID-19 03/2021  ? diarrhea x 3 days cough /fever x 2 days all symptoms resolved  ? Pneumonia   ? 03/2020   ? PONV (postoperative nausea and vomiting)   ? pt requests scopolamine patch dos  ? Urinary incontinence   ? especially with exercise  ? Wears glasses   ? or contacts  ? ? ?Past Surgical History:  ?Procedure Laterality Date  ? BLADDER SUSPENSION N/A 09/12/2021  ? Procedure: TRANSVAGINAL TAPE (TVT) PROCEDURE;  Surgeon: Jaquita Folds, MD;  Location: [facility];  Service: Gynecology;  Laterality: N/A;  ? BREAST REDUCTION SURGERY Bilateral 03/03/2020  ? Procedure: BREAST REDUCTION WITH LIPOSUCTION;  Surgeon: Wallace Going, DO;  Location: [facility];  Service: Plastics;  Laterality: Bilateral;  ? CYSTOSCOPY N/A 09/12/2021  ? Procedure: CYSTOSCOPY;  Surgeon: Jaquita Folds, MD;  Location: [facility];  Service: Gynecology;  Laterality: N/A;  ? DILATION AND CURETTAGE OF UTERUS    ? due to heavy cycles  ? ENDOMETRIAL ABLATION  08/29/2007  ? Dr. Quincy Simmonds  ? ROBOTIC ASSISTED LAPAROSCOPIC SACROCOLPOPEXY N/A 09/12/2021  ? Procedure: XI ROBOTIC  ASSISTED LAPAROSCOPIC SACROCOLPOPEXY;  Surgeon: Jaquita Folds, MD;  Location: [facility];  Service: Gynecology;  Laterality: N/A;  ? ROBOTIC ASSISTED TOTAL HYSTERECTOMY N/A 09/12/2021  ? Procedure: XI ROBOTIC ASSISTED TOTAL HYSTERECTOMY with bilateral salpingectomy;  Surgeon: Jaquita Folds, MD;  Location: [facility];  Service: Gynecology;  Laterality: N/A;  total time needed 3.5hrs  ? ? ?There were no vitals filed for this visit. ? ? Subjective Assessment - 01/11/22 1410   ? ? Subjective Pain and discomfort in the Lt LB at the end of the day. Stretches feel good and she has continued to work on exercises at home.   ? Currently in Pain? No/denies   ? Pain Score 0-No pain   ? Pain Location Back   ? Pain Orientation Left;Lower   ? ?  ?  ? ?  ? ? ? ? ? ? ? ? ? ? ? ? ? ? ? ? ? ? ? ? [place] Adult PT Treatment/Exercise - 01/11/22 0001   ? ?  ? Lumbar Exercises: Stretches  ? Hip Flexor Stretch Right;Left;3 reps;30 seconds   ? Hip Flexor Stretch Limitations supine thomas and sitting   ? Standing Extension 1 rep   ? Standing Extension Limitations 2-3 sec   ? Press Ups 5 reps;5 seconds   ? Press Ups Limitations through partial range   ?  Other Lumbar Stretch Exercise trunk rotation in T position 10 sec x 3 rep   ?  ? Lumbar Exercises: Standing  ? Row Strengthening;Both;20 reps;Theraband   ? Theraband Level (Row) Level 3 (Green)   ? Other Standing Lumbar Exercises antirotation green TB one strip x 10 each side   ?  ? Lumbar Exercises: Seated  ? Sit to Stand 10 reps   ? Sit to Stand Limitations C to engage core and move slowly   ?  ? Lumbar Exercises: Supine  ? AB Set Limitations 4 part core 10 sec x 5   ?  ? Lumbar Exercises: Prone  ? Other Prone Lumbar Exercises glut set 5 sec x 5 reps   ?  ? Lumbar Exercises: Quadruped  ? Madcat/Old Horse 5 reps   ? Madcat/Old Horse Limitations 5 sec hold   ? Other Quadruped Lumbar Exercises childs pose 10-15 sec hold x 3 reps   ? ?  ?  ? ?   ? ? ? ? ? ? ? ? ? ? PT Education - 01/11/22 1435   ? ? Education Details HEP   ? Person(s) Educated Patient   ? Methods Explanation;Demonstration;Tactile cues;Verbal cues;Handout   ? Comprehension Verbalized understanding;Returned demonstration;Verbal cues required;Tactile cues required   ? ?  ?  ? ?  ? ? ? ? ? ? PT Long Term Goals - 01/02/22 1314   ? ?  ? PT LONG TERM GOAL #1  ? Title The patient will be independent with HEP for mid/low back stabilization and postural stretching.   ? Time 6   ? Period Weeks   ? Status New   ? Target Date 02/13/22   ?  ? PT LONG TERM GOAL #2  ? Title The patient will report being able to perform recreational activities/ household chores including yard work without c/o low and mid back pain.   ? Time 6   ? Period Weeks   ? Status New   ? Target Date 02/13/22   ?  ? PT LONG TERM GOAL #3  ? Title Patient demonstrates improved upright posture with normal length through the hip flexors no longer standing with hip flexed posture   ? Time 6   ? Period Weeks   ? Status New   ? Target Date 02/13/22   ?  ? PT LONG TERM GOAL #4  ? Title Independent in [place]   ? Time 6   ? Period Weeks   ? Status New   ? Target Date 02/13/22   ?  ? PT LONG TERM GOAL #5  ? Title Improve functional limitation score to 73   ? Time 6   ? Period Weeks   ? Status New   ? Target Date 02/13/22   ? ?  ?  ? ?  ? ? ? ? ? ? ? ? Plan - 01/11/22 1413   ? ? Clinical Impression Statement Good progress with no significant pain in the LB. Some discomfort Lt LB associated with fatigue at the end of the day. Torleated additioinal stretching and strenthening exercises well.   ? Rehab Potential Good   ? PT Frequency 1x / week   ? PT Duration 6 weeks   ? PT Treatment/Interventions ADLs/Self Care Home Management;Aquatic Therapy;Cryotherapy;Electrical Stimulation;Iontophoresis '4mg'$ /ml Dexamethasone;Moist Heat;Ultrasound;Functional mobility training;Therapeutic activities;Therapeutic exercise;Neuromuscular re-education;Patient/family  education;Manual techniques;Dry needling;Taping   ? PT Next Visit Plan review HEP; progress with core stabilizaton and strengthening focus on core/hip hinge/hip extensor strengthening; modalities/manual work/DN  as indicated (has TENS unit at home) Add hip extension and abduction in standing, step ups   ? PT Home Exercise Plan [email or web address]   ? Consulted and Agree with Plan of Care Patient   ? ?  ?  ? ?  ? ? ?Patient will benefit from skilled therapeutic intervention in order to improve the following deficits and impairments:    ? ?Visit Diagnosis: ?Acute midline low back pain without sciatica ? ?Abnormal posture ? ?Muscle weakness (generalized) ? ? ? ? ?Problem List ?Patient Active Problem List  ? Diagnosis Date Noted  ? Uterovaginal prolapse, incomplete 09/12/2021  ? Left lower quadrant pain 06/14/2021  ? Nausea 06/14/2021  ? Elevated LDL cholesterol level 02/24/2021  ? S/P bilateral breast reduction 03/26/2020  ? Lumbar spinal stenosis 10/27/2019  ? Hyperlipidemia LDL goal <130 05/01/2019  ? Elevated fasting glucose 05/01/2019  ? Vitamin D insufficiency 04/30/2019  ? Incomplete uterovaginal prolapse 04/01/2019  ? Stress incontinence 04/01/2019  ? Morbid obesity ([place]) 01/01/2019  ? Symptomatic mammary hypertrophy 01/01/2019  ? B12 nutritional deficiency 02/06/2018  ? Class 3 severe obesity due to excess calories without serious comorbidity with body mass index (BMI) of 40.0 to 44.9 in adult [facility]) 02/05/2018  ? Pulmonary nodule 06/19/2017  ? Elevated liver enzymes 05/23/2017  ? Pain of upper abdomen 05/23/2017  ? Spleen enlarged 05/23/2017  ? Poison ivy dermatitis 01/02/2013  ? Family history of early CAD 07/23/2012  ? Family history of diabetes mellitus 07/23/2012  ? Heart murmur 07/23/2012  ? ? ?Everardo All, PT, MPH  ?01/11/2022, 2:42 PM ? ?[facility] ?[place] ?[place], [place], [address] ?Phone: [phone]   Fax:  [phone] ? ?Name: LAMIA MARINER ?MRN:  [id number] ?Date of Birth: [birth year] ? ? ? ?

## 2022-01-11 NOTE — Patient Instructions (Signed)
Access Code: [email or web address] ?URL: [email or web address] ?Date: 01/11/2022 ?Prepared by: Gillermo Murdoch ? ?Exercises ?- Prone Press Up  - 2 x daily - 7 x weekly - 1 sets - 10 reps - 2-3 sec  hold ?- Standing Lumbar Extension  - 2 x daily - 7 x weekly - 1 sets - 2-3 reps - 2-3 sec  hold ?- Hip Flexor Stretch at Edge of Bed  - 2 x daily - 7 x weekly - 1 sets - 3 reps - 30 sec  hold ?- Seated Hip Flexor Stretch  - 2 x daily - 7 x weekly - 1 sets - 3 reps - 30 sec  hold ?- Prone Gluteal Sets  - 2 x daily - 7 x weekly - 1 sets - 10 reps - 10 sec  hold ?- Supine Transversus Abdominis Bracing with Pelvic Floor Contraction  - 2 x daily - 7 x weekly - 1 sets - 10 reps - 10sec  hold ?- Cat Cow  - 2 x daily - 7 x weekly - 1 sets - 5-10 reps - 3-5 sec  hold ?- Cat Cow to Child's Pose  - 2 x daily - 7 x weekly - 1 sets - 3 reps - 30 sec  hold ?- Sit to Stand  - 2 x daily - 7 x weekly - 1 sets - 10 reps - 3-5 sec  hold ?- Anti-Rotation Lateral Stepping with Press  - 2 x daily - 7 x weekly - 1-2 sets - 10 reps - 2-3 sec  hold ?- Standing Bilateral Low Shoulder Row with Anchored Resistance  - 2 x daily - 7 x weekly - 1-3 sets - 10 reps - 2-3 sec  hold ?

## 2022-01-18 ENCOUNTER — Ambulatory Visit: Payer: No Typology Code available for payment source | Admitting: Rehabilitative and Restorative Service Providers"

## 2022-01-18 ENCOUNTER — Encounter: Payer: Self-pay | Admitting: Rehabilitative and Restorative Service Providers"

## 2022-01-18 DIAGNOSIS — M48061 Spinal stenosis, lumbar region without neurogenic claudication: Secondary | ICD-10-CM | POA: Diagnosis not present

## 2022-01-18 DIAGNOSIS — R293 Abnormal posture: Secondary | ICD-10-CM

## 2022-01-18 DIAGNOSIS — M6281 Muscle weakness (generalized): Secondary | ICD-10-CM

## 2022-01-18 DIAGNOSIS — M545 Low back pain, unspecified: Secondary | ICD-10-CM

## 2022-01-18 NOTE — Patient Instructions (Signed)
Access Code: [email or web address] URL: [email or web address] Date: 01/18/2022 Prepared by: Gillermo Murdoch  Exercises - Prone Press Up  - 2 x daily - 7 x weekly - 1 sets - 10 reps - 2-3 sec  hold - Standing Lumbar Extension  - 2 x daily - 7 x weekly - 1 sets - 2-3 reps - 2-3 sec  hold - Hip Flexor Stretch at Edge of Bed  - 2 x daily - 7 x weekly - 1 sets - 3 reps - 30 sec  hold - Seated Hip Flexor Stretch  - 2 x daily - 7 x weekly - 1 sets - 3 reps - 30 sec  hold - Prone Gluteal Sets  - 2 x daily - 7 x weekly - 1 sets - 10 reps - 10 sec  hold - Supine Transversus Abdominis Bracing with Pelvic Floor Contraction  - 2 x daily - 7 x weekly - 1 sets - 10 reps - 10sec  hold - Cat Cow  - 2 x daily - 7 x weekly - 1 sets - 5-10 reps - 3-5 sec  hold - Cat Cow to Child's Pose  - 2 x daily - 7 x weekly - 1 sets - 3 reps - 30 sec  hold - Sit to Stand  - 2 x daily - 7 x weekly - 1 sets - 10 reps - 3-5 sec  hold - Anti-Rotation Lateral Stepping with Press  - 2 x daily - 7 x weekly - 1-2 sets - 10 reps - 2-3 sec  hold - Standing Bilateral Low Shoulder Row with Anchored Resistance  - 2 x daily - 7 x weekly - 1-3 sets - 10 reps - 2-3 sec  hold - Kneeling Thoracic Sidebending with Swiss Ball  - 2 x daily - 7 x weekly - 1 sets - 3 reps - 30 sec  hold - Drawing Bow  - 1 x daily - 7 x weekly - 1 sets - 10 reps - 3 sec  hold - Modified Deadlift with Pelvic Contraction  - 1 x daily - 7 x weekly - 1 sets - 10 reps

## 2022-01-18 NOTE — Therapy (Addendum)
[place], [place], [address] Phone: [phone]   Fax:  [phone]  Physical Therapy Treatment Rationale for Evaluation and Treatment Rehabilitation   PHYSICAL THERAPY DISCHARGE SUMMARY  Visits from Start of Care: 3  Current functional level related to goals / functional outcomes: See progress note for discharge status    Remaining deficits: No known deficits    Education / Equipment: HEP  Patient agrees to discharge. Patient goals were met. Patient is being discharged due to meeting the stated rehab goals.   Keven Soucy P. Helene Kelp PT, MPH 02/22/22 4:08 PM  Patient Details  Name: Regina Mcconnell MRN: [id number] Date of Birth: 06-18-1971 Referring Provider (PT): Dt Dianah Field   Encounter Date: 01/18/2022   PT End of Session - 01/18/22 0933     Visit Number 3    Number of Visits 12    Date for PT Re-Evaluation 02/13/22    PT Start Time 0932    PT Stop Time 2182    PT Time Calculation (min) 43 min             Past Medical History:  Diagnosis Date   Abnormal Pap smear of cervix 1995   --hx colposcopy w/bx and cryotherapy of cervix(paps normal since)   Heart murmur    as child and young child none since   History of COVID-19 03/2021   diarrhea x 3 days cough /fever x 2 days all symptoms resolved   Pneumonia    03/2020    PONV (postoperative nausea and vomiting)    pt requests scopolamine patch dos   Urinary incontinence    especially with exercise   Wears glasses    or contacts    Past Surgical History:  Procedure Laterality Date   BLADDER SUSPENSION N/A 09/12/2021   Procedure: TRANSVAGINAL TAPE (TVT) PROCEDURE;  Surgeon: Jaquita Folds, MD;  Location: [facility];  Service: Gynecology;  Laterality: N/A;   BREAST REDUCTION SURGERY Bilateral 03/03/2020   Procedure: BREAST REDUCTION WITH LIPOSUCTION;  Surgeon: Wallace Going, DO;  Location: [facility];  Service: Plastics;  Laterality: Bilateral;   CYSTOSCOPY N/A 09/12/2021   Procedure: CYSTOSCOPY;  Surgeon: Jaquita Folds, MD;  Location: [facility];  Service: Gynecology;  Laterality: N/A;   DILATION AND CURETTAGE OF UTERUS     due to heavy cycles   ENDOMETRIAL ABLATION  08/29/2007   Dr. Quincy Simmonds   ROBOTIC ASSISTED LAPAROSCOPIC SACROCOLPOPEXY N/A 09/12/2021   Procedure: XI ROBOTIC ASSISTED LAPAROSCOPIC SACROCOLPOPEXY;  Surgeon: Jaquita Folds, MD;  Location: [facility];  Service: Gynecology;  Laterality: N/A;   ROBOTIC ASSISTED TOTAL HYSTERECTOMY N/A 09/12/2021   Procedure: XI ROBOTIC ASSISTED TOTAL HYSTERECTOMY with bilateral salpingectomy;  Surgeon: Jaquita Folds, MD;  Location: [facility];  Service: Gynecology;  Laterality: N/A;  total time needed 3.5hrs    There were no vitals filed for this visit.   Subjective Assessment - 01/18/22 0934     Subjective Patient reports that she trimmed bushes every night last week and some over the weekend. She has had some increased pain on the Lt LB. She used the TENS unit with some help. She has minimal pain today. She has done some exercises but not all. Knows she needs to continue with her exercises consistently.    Currently in Pain? No/denies    Pain Score 0-No pain  [place] Adult PT Treatment/Exercise - 01/18/22 0001       Self-Care   Lifting modified deadlift 15 # KB hinged hips x 10 reps      Lumbar Exercises: Stretches   Hip Flexor Stretch Right;Left;3 reps;30 seconds    Hip Flexor Stretch Limitations supine thomas and sitting      Lumbar Exercises: Aerobic   Nustep L5 x 5 reps      Lumbar Exercises: Standing   Row Strengthening;Both;20 reps;Theraband    Theraband Level (Row) Level 3 (Green)    Row Limitations bow and arrow blue TB x 10 each side    Other Standing Lumbar Exercises antirotation green TB  one strip x 10 each side      Lumbar Exercises: Seated   Sit to Stand 10 reps   holding 10# KB   Sit to Stand Limitations VC to engage core and move slowly      Lumbar Exercises: Quadruped   Madcat/Old Horse 5 reps    Madcat/Old Horse Limitations 5 sec hold    Other Quadruped Lumbar Exercises childs pose 10-15 sec hold x 3 reps    Other Quadruped Lumbar Exercises lateral trunk in child's pose                     PT Education - 01/18/22 1010     Education Details HEP    Person(s) Educated Patient    Methods Explanation;Demonstration;Tactile cues;Verbal cues;Handout    Comprehension Verbalized understanding;Returned demonstration;Verbal cues required;Tactile cues required                 PT Long Term Goals - 01/02/22 1314       PT LONG TERM GOAL #1   Title The patient will be independent with HEP for mid/low back stabilization and postural stretching.    Time 6    Period Weeks    Status New    Target Date 02/13/22      PT LONG TERM GOAL #2   Title The patient will report being able to perform recreational activities/ household chores including yard work without c/o low and mid back pain.    Time 6    Period Weeks    Status New    Target Date 02/13/22      PT LONG TERM GOAL #3   Title Patient demonstrates improved upright posture with normal length through the hip flexors no longer standing with hip flexed posture    Time 6    Period Weeks    Status New    Target Date 02/13/22      PT LONG TERM GOAL #4   Title Independent in HEP    Time 6    Period Weeks    Status New    Target Date 02/13/22      PT LONG TERM GOAL #5   Title Improve functional limitation score to 73    Time 6    Period Weeks    Status New    Target Date 02/13/22                   Plan - 01/18/22 0937     Clinical Impression Statement Flare up of symptoms related to trimming the bushes at home.. She has done some of the exercises but not worked on core  stabilization. Reviewed and added stretching and strengthening exercises for HEP. Continued education re- posture and alignment and the importance of consistent HEP.    Rehab Potential Good  PT Frequency 1x / week    PT Duration 6 weeks    PT Treatment/Interventions ADLs/Self Care Home Management;Aquatic Therapy;Cryotherapy;Electrical Stimulation;Iontophoresis 45m/ml Dexamethasone;Moist Heat;Ultrasound;Functional mobility training;Therapeutic activities;Therapeutic exercise;Neuromuscular re-education;Patient/family education;Manual techniques;Dry needling;Taping    PT Next Visit Plan review HEP; progress with core stabilizaton and strengthening focus on core/hip hinge/hip extensor strengthening; modalities/manual work/DN as indicated (has TENS unit at home) Patient will call to schedule as needed.    PT Home Exercise Plan [email or web address]   Consulted and Agree with Plan of Care Patient             Patient will benefit from skilled therapeutic intervention in order to improve the following deficits and impairments:     Visit Diagnosis: Acute midline low back pain without sciatica  Abnormal posture  Muscle weakness (generalized)     Problem List Patient Active Problem List   Diagnosis Date Noted   Uterovaginal prolapse, incomplete 09/12/2021   Left lower quadrant pain 06/14/2021   Nausea 06/14/2021   Elevated LDL cholesterol level 02/24/2021   S/P bilateral breast reduction 03/26/2020   Lumbar spinal stenosis 10/27/2019   Hyperlipidemia LDL goal <130 05/01/2019   Elevated fasting glucose 05/01/2019   Vitamin D insufficiency 04/30/2019   Incomplete uterovaginal prolapse 04/01/2019   Stress incontinence 04/01/2019   Morbid obesity ([place] 01/01/2019   Symptomatic mammary hypertrophy 01/01/2019   B12 nutritional deficiency 02/06/2018   Class 3 severe obesity due to excess calories without serious comorbidity with body mass index (BMI) of 40.0 to 44.9 in adult ([place] 02/05/2018    Pulmonary nodule 06/19/2017   Elevated liver enzymes 05/23/2017   Pain of upper abdomen 05/23/2017   Spleen enlarged 05/23/2017   Poison ivy dermatitis 01/02/2013   Family history of early CAD 07/23/2012   Family history of diabetes mellitus 07/23/2012   Heart murmur 07/23/2012    Regina Mcconnell PNilda Simmer PT, MPH  01/18/2022, 10:16 AM  [facility] [place] [address]Phone: [phone]  Fax:  [phone] Name: Regina STOLTZFUSMRN: [id number] of Birth: 904-17-1972

## 2022-02-20 ENCOUNTER — Ambulatory Visit
Admission: RE | Admit: 2022-02-20 | Discharge: 2022-02-20 | Disposition: A | Discharge status: Home / Self Care | Payer: No Typology Code available for payment source | Source: Ambulatory Visit | Attending: Physician Assistant | Admitting: Physician Assistant

## 2022-02-20 DIAGNOSIS — Z1231 Encounter for screening mammogram for malignant neoplasm of breast: Secondary | ICD-10-CM

## 2022-03-29 ENCOUNTER — Other Ambulatory Visit: Payer: Self-pay | Admitting: Neurology

## 2022-03-29 DIAGNOSIS — E559 Vitamin D deficiency, unspecified: Secondary | ICD-10-CM

## 2022-03-29 DIAGNOSIS — Z0000 Encounter for general adult medical examination without abnormal findings: Secondary | ICD-10-CM

## 2022-03-29 DIAGNOSIS — E7800 Pure hypercholesterolemia, unspecified: Secondary | ICD-10-CM

## 2022-03-29 DIAGNOSIS — R7301 Impaired fasting glucose: Secondary | ICD-10-CM

## 2022-03-30 ENCOUNTER — Ambulatory Visit (AMBULATORY_SURGERY_CENTER): Payer: Self-pay | Admitting: *Deleted

## 2022-03-30 ENCOUNTER — Other Ambulatory Visit (HOSPITAL_BASED_OUTPATIENT_CLINIC_OR_DEPARTMENT_OTHER): Payer: Self-pay

## 2022-03-30 VITALS — Ht 64.0 in | Wt 194.4 lb

## 2022-03-30 DIAGNOSIS — Z1211 Encounter for screening for malignant neoplasm of colon: Secondary | ICD-10-CM

## 2022-03-30 MED ORDER — NA SULFATE-K SULFATE-MG SULF 17.5-3.13-1.6 GM/177ML PO SOLN
1.0000 | Freq: Once | ORAL | 0 refills | Status: AC
  Filled 2022-03-30: qty 354, 1d supply, fill #0

## 2022-03-30 NOTE — Progress Notes (Signed)
No egg or soy allergy known to patient  No issues known to pt with past sedation with any surgeries or procedures Patient denies ever being told they had issues or difficulty with intubation  No FH of Malignant Hyperthermia Pt is not on diet pills Pt is not on  home 02  Pt is not on blood thinners  Pt has issues with constipation  No A fib or A flutter Have any cardiac testing pending--no Pt instructed to use [employer] or GoodRx for a price reduction on prep    Double prep given d/t issues with constipation,only have bowel movements once a week.  Been off ozempic for month and half.

## 2022-04-17 ENCOUNTER — Encounter (HOSPITAL_BASED_OUTPATIENT_CLINIC_OR_DEPARTMENT_OTHER): Payer: Self-pay

## 2022-04-17 ENCOUNTER — Encounter: Payer: Self-pay | Admitting: Physician Assistant

## 2022-04-17 ENCOUNTER — Other Ambulatory Visit (HOSPITAL_BASED_OUTPATIENT_CLINIC_OR_DEPARTMENT_OTHER): Payer: Self-pay

## 2022-04-17 ENCOUNTER — Ambulatory Visit (INDEPENDENT_AMBULATORY_CARE_PROVIDER_SITE_OTHER): Payer: No Typology Code available for payment source | Admitting: Physician Assistant

## 2022-04-17 VITALS — BP 119/67 | HR 79 | Ht 64.0 in | Wt 196.0 lb

## 2022-04-17 DIAGNOSIS — Z23 Encounter for immunization: Secondary | ICD-10-CM | POA: Diagnosis not present

## 2022-04-17 DIAGNOSIS — Z0000 Encounter for general adult medical examination without abnormal findings: Secondary | ICD-10-CM

## 2022-04-17 DIAGNOSIS — E559 Vitamin D deficiency, unspecified: Secondary | ICD-10-CM

## 2022-04-17 DIAGNOSIS — E6609 Other obesity due to excess calories: Secondary | ICD-10-CM | POA: Diagnosis not present

## 2022-04-17 DIAGNOSIS — Z6833 Body mass index (BMI) 33.0-33.9, adult: Secondary | ICD-10-CM

## 2022-04-17 DIAGNOSIS — E8881 Metabolic syndrome: Secondary | ICD-10-CM

## 2022-04-17 DIAGNOSIS — E66812 Other obesity due to excess calories: Secondary | ICD-10-CM | POA: Insufficient documentation

## 2022-04-17 MED ORDER — OZEMPIC (1 MG/DOSE) 4 MG/3ML ~~LOC~~ SOPN
1.0000 mg | PEN_INJECTOR | SUBCUTANEOUS | 1 refills | Status: DC
  Filled 2022-04-17: qty 3, 28d supply, fill #0
  Filled 2022-05-11: qty 9, 84d supply, fill #0
  Filled 2022-07-31: qty 9, 84d supply, fill #1

## 2022-04-17 NOTE — Progress Notes (Signed)
Complete physical exam  Patient: Regina Mcconnell   DOB: 1971/07/15   51 y.o. Female  MRN: [id number]  Subjective:    Chief Complaint  Patient presents with   Annual Exam    Regina Mcconnell is a 51 y.o. female who presents today for a complete physical exam. She reports consuming a general diet.  She walks and tries to stay generally active  She generally feels well. She reports sleeping well. She does not have additional problems to discuss today.   She has been on ozempic for weight loss and doing well.     Most recent depression screenings:    04/17/2022    1:52 PM 02/23/2021   10:15 AM  PHQ 2/9 Scores  PHQ - 2 Score 0 0    Vision:Within last year and Dental: No current dental problems  Patient Active Problem List   Diagnosis Date Noted   Class 1 obesity due to excess calories without serious comorbidity with body mass index (BMI) of 33.0 to 33.9 in adult 04/17/2022   Uterovaginal prolapse, incomplete 09/12/2021   Left lower quadrant pain 06/14/2021   Nausea 06/14/2021   Elevated LDL cholesterol level 02/24/2021   S/P bilateral breast reduction 03/26/2020   Lumbar spinal stenosis 10/27/2019   Hyperlipidemia LDL goal <130 05/01/2019   Elevated fasting glucose 05/01/2019   Vitamin D insufficiency 04/30/2019   Incomplete uterovaginal prolapse 04/01/2019   Stress incontinence 04/01/2019   Morbid obesity ([place]) 01/01/2019   Symptomatic mammary hypertrophy 01/01/2019   B12 nutritional deficiency 02/06/2018   Class 3 severe obesity due to excess calories without serious comorbidity with body mass index (BMI) of 40.0 to 44.9 in adult ([place]) 02/05/2018   Pulmonary nodule 06/19/2017   Elevated liver enzymes 05/23/2017   Pain of upper abdomen 05/23/2017   Spleen enlarged 05/23/2017   Poison ivy dermatitis 01/02/2013   Family history of early CAD 07/23/2012   Family history of diabetes mellitus 07/23/2012   Heart murmur 07/23/2012   Past Medical History:  Diagnosis Date    Abnormal Pap smear of cervix 1995   --hx colposcopy w/bx and cryotherapy of cervix(paps normal since)   Allergy    seasonal   Blood transfusion without reported diagnosis    during surgery   Heart murmur    as child and young child none since   History of COVID-19 03/2021   diarrhea x 3 days cough /fever x 2 days all symptoms resolved   Pneumonia    03/2020    PONV (postoperative nausea and vomiting)    pt requests scopolamine patch dos   Urinary incontinence    especially with exercise   Wears glasses    or contacts   Family History  Problem Relation Age of Onset   Diabetes Mother    Thyroid disease Mother    Heart disease Father    Heart disease Paternal Uncle        all deceased from heart attacks   Stroke Paternal Grandmother    Breast cancer Neg Hx    Colon cancer Neg Hx    Colon polyps Neg Hx    Crohn's disease Neg Hx    Esophageal cancer Neg Hx    Rectal cancer Neg Hx    Stomach cancer Neg Hx    No Known Allergies    Patient Care Team: Lavada Mesi as PCP - General (Family Medicine) Nunzio Cobbs, MD as Attending Physician (Obstetrics and Gynecology)   Outpatient  Medications Prior to Visit  Medication Sig   [DISCONTINUED] predniSONE (DELTASONE) 50 MG tablet One tab PO daily for 5 days.   [DISCONTINUED] Semaglutide, 1 MG/DOSE, (OZEMPIC, 1 MG/DOSE,) 4 MG/3ML SOPN Inject 1 mg as directed once a week.   [DISCONTINUED] Semaglutide, 1 MG/DOSE, (OZEMPIC, 1 MG/DOSE,) 4 MG/3ML SOPN Inject 1 mg as directed once a week.   No facility-administered medications prior to visit.    Review of Systems  All other systems reviewed and are negative.         Objective:     BP 119/67   Pulse 79   Ht '5\' 4"'$  (1.626 m)   Wt 196 lb (88.9 kg)   SpO2 99%   BMI 33.64 kg/m  BP Readings from Last 3 Encounters:  04/17/22 119/67  10/25/21 132/81  09/12/21 105/65   Wt Readings from Last 3 Encounters:  04/17/22 196 lb (88.9 kg)  03/30/22 194 lb 6.4  oz (88.2 kg)  09/12/21 205 lb 1.6 oz (93 kg)    ..    04/17/2022    1:52 PM 02/23/2021   10:15 AM 04/30/2019    7:31 AM 02/05/2018    8:58 AM  Depression screen PHQ 2/9  Decreased Interest 0 0 0 0  Down, Depressed, Hopeless 0 0 0 0  PHQ - 2 Score 0 0 0 0  Altered sleeping   0   Tired, decreased energy   0   Change in appetite   0   Feeling bad or failure about yourself    0   Trouble concentrating   0   Moving slowly or fidgety/restless   0   Suicidal thoughts   0   PHQ-9 Score   0   Difficult doing work/chores   Not difficult at all      Physical Exam  BP 119/67   Pulse 79   Ht '5\' 4"'$  (1.626 m)   Wt 196 lb (88.9 kg)   SpO2 99%   BMI 33.64 kg/m   General Appearance:    Alert, cooperative, obese, no distress, appears stated age  Head:    Normocephalic, without obvious abnormality, atraumatic  Eyes:    PERRL, conjunctiva/corneas clear, EOM's intact, fundi    benign, both eyes  Ears:    Normal TM's and external ear canals, both ears  Nose:   Nares normal, septum midline, mucosa normal, no drainage    or sinus tenderness  Throat:   Lips, mucosa, and tongue normal; teeth and gums normal  Neck:   Supple, symmetrical, trachea midline, no adenopathy;    thyroid:  no enlargement/tenderness/nodules; no carotid   bruit or JVD  Back:     Symmetric, no curvature, ROM normal, no CVA tenderness  Lungs:     Clear to auscultation bilaterally, respirations unlabored  Chest Wall:    No tenderness or deformity   Heart:    Regular rate and rhythm, S1 and S2 normal, no murmur, rub   or gallop     Abdomen:     Soft, non-tender, bowel sounds active all four quadrants,    no masses, no organomegaly        Extremities:   Extremities normal, atraumatic, no cyanosis or edema  Pulses:   2+ and symmetric all extremities  Skin:   Skin color, texture, turgor normal, no rashes or lesions  Lymph nodes:   Cervical, supraclavicular, and axillary nodes normal  Neurologic:   CNII-XII intact, normal  strength, sensation and reflexes  throughout      Assessment & Plan:    Routine Health Maintenance and Physical Exam  Immunization History  Administered Date(s) Administered   Influenza Split 07/18/2012   Influenza,inj,Quad PF,6+ Mos 05/28/2020   Influenza-Unspecified 06/14/2018, 06/20/2019   Tdap 02/09/2012    Health Maintenance  Topic Date Due   COLONOSCOPY (Pts 45-36yr Insurance coverage will need to be confirmed)  Never done   TETANUS/TDAP  02/08/2022   COVID-19 Vaccine (1) 05/03/2022 (Originally 11/06/1971)   Zoster Vaccines- Shingrix (1 of 2) 07/18/2022 (Originally 05/08/2021)   INFLUENZA VACCINE  11/26/2022 (Originally 03/28/2022)   HIV Screening  04/18/2023 (Originally 05/08/1986)   MAMMOGRAM  02/21/2023   Hepatitis C Screening  Completed   HPV VACCINES  Aged Out    Discussed health benefits of physical activity, and encouraged her to engage in regular exercise appropriate for her age and condition.     .Marland KitchenAbigail Buttswas seen today for annual exam.  Diagnoses and all orders for this visit:  Routine general medical examination at a health care facility  Class 1 obesity due to excess calories without serious comorbidity with body mass index (BMI) of 33.0 to 33.9 in adult -     Semaglutide, 1 MG/DOSE, (OZEMPIC, 1 MG/DOSE,) 4 MG/3ML SOPN; Inject 1 mg as directed once a week.  Need for Tdap vaccination  Vitamin D insufficiency  Metabolic syndrome -     Semaglutide, 1 MG/DOSE, (OZEMPIC, 1 MG/DOSE,) 4 MG/3ML SOPN; Inject 1 mg as directed once a week.   .. Discussed 150 minutes of exercise a week.  Encouraged vitamin D 1000 units and Calcium '1300mg'$  or 4 servings of dairy a day.  Fasting labs ordered PHQ no concerns Hysterectomy and pap not needed Mammogram scheduled Colonoscopy scheduled Declined shingrix today, discussed risk of not being vaccinated Tdap given Flu declined and will get at work  ..Marland Kitcheniscussed low carb diet with 1500 calories and 80g of protein.   Exercising at least 150 minutes a week.  My Fitness Pal could be a gMicrobiologist  Refilled ozempic for weight management Follow up in 6 months   JIran Planas PA-C

## 2022-04-17 NOTE — Patient Instructions (Signed)

## 2022-04-18 ENCOUNTER — Encounter: Payer: Self-pay | Admitting: Physician Assistant

## 2022-04-18 DIAGNOSIS — R7989 Other specified abnormal findings of blood chemistry: Secondary | ICD-10-CM | POA: Insufficient documentation

## 2022-04-18 LAB — CBC WITH DIFFERENTIAL/PLATELET
Absolute Monocytes: 431 cells/uL (ref 200–950)
Basophils Absolute: 47 cells/uL (ref 0–200)
Basophils Relative: 0.8 %
Eosinophils Absolute: 118 cells/uL (ref 15–500)
Eosinophils Relative: 2 %
HCT: 40.9 % (ref 35.0–45.0)
Hemoglobin: 13.8 g/dL (ref 11.7–15.5)
Lymphs Abs: 2838 cells/uL (ref 850–3900)
MCH: 30.4 pg (ref 27.0–33.0)
MCHC: 33.7 g/dL (ref 32.0–36.0)
MCV: 90.1 fL (ref 80.0–100.0)
MPV: 10.4 fL (ref 7.5–12.5)
Monocytes Relative: 7.3 %
Neutro Abs: 2466 cells/uL (ref 1500–7800)
Neutrophils Relative %: 41.8 %
Platelets: 246 10*3/uL (ref 140–400)
RBC: 4.54 10*6/uL (ref 3.80–5.10)
RDW: 12.2 % (ref 11.0–15.0)
Total Lymphocyte: 48.1 %
WBC: 5.9 10*3/uL (ref 3.8–10.8)

## 2022-04-18 LAB — LIPID PANEL W/REFLEX DIRECT LDL
Cholesterol: 220 mg/dL — ABNORMAL HIGH (ref <200)
HDL: 56 mg/dL (ref >=50)
LDL Cholesterol (Calc): 141 mg/dL (calc) — ABNORMAL HIGH
Non-HDL Cholesterol (Calc): 164 mg/dL (calc) — ABNORMAL HIGH (ref <130)
Total CHOL/HDL Ratio: 3.9 (calc) (ref <5.0)
Triglycerides: 117 mg/dL (ref <150)

## 2022-04-18 LAB — TSH: TSH: 3.57 mIU/L

## 2022-04-18 LAB — COMPLETE METABOLIC PANEL WITH GFR
AG Ratio: 1.7 (calc) (ref 1.0–2.5)
ALT: 11 U/L (ref 6–29)
AST: 15 U/L (ref 10–35)
Albumin: 4.4 g/dL (ref 3.6–5.1)
Alkaline phosphatase (APISO): 72 U/L (ref 37–153)
BUN/Creatinine Ratio: 18 (calc) (ref 6–22)
BUN: 20 mg/dL (ref 7–25)
CO2: 25 mmol/L (ref 20–32)
Calcium: 10 mg/dL (ref 8.6–10.4)
Chloride: 104 mmol/L (ref 98–110)
Creat: 1.09 mg/dL — ABNORMAL HIGH (ref 0.50–1.03)
Globulin: 2.6 g/dL (calc) (ref 1.9–3.7)
Glucose, Bld: 95 mg/dL (ref 65–99)
Potassium: 5.2 mmol/L (ref 3.5–5.3)
Sodium: 139 mmol/L (ref 135–146)
Total Bilirubin: 0.6 mg/dL (ref 0.2–1.2)
Total Protein: 7 g/dL (ref 6.1–8.1)
eGFR: 62 mL/min/{1.73_m2} (ref >=60)

## 2022-04-18 LAB — VITAMIN D 25 HYDROXY (VIT D DEFICIENCY, FRACTURES): Vit D, 25-Hydroxy: 34 ng/mL (ref 30–100)

## 2022-04-18 NOTE — Progress Notes (Signed)
Laurann,   Vitamin D in normal range but still on low normal. Make sure taking at least 1000 units daily.  Normal hemoglobin and WBC. TSH in normal range. Glucose and liver look great.  Your kidney function did drop some from 1 year ago and serum creatinine went up. You could be a little dehydrated but we need to check this in 4-6 weeks to make sure not trending down.   Your LdL is not to goal but BETTER than last year.  Your HDL looks great.  Your overall CV risk for 10 years is 1.2 percent which is pretty low.  Continue with diet and exercise to lower LDL.  Recheck in 1 year.   Marland Kitchen.The 10-year ASCVD risk score (Arnett DK, et al., 2019) is: 1.2%   Values used to calculate the score:     Age: 51 years     Sex: Female     Is Non-Hispanic African American: No     Diabetic: No     Tobacco smoker: No     Systolic Blood Pressure: 356 mmHg     Is BP treated: No     HDL Cholesterol: 56 mg/dL     Total Cholesterol: 220 mg/dL

## 2022-04-19 ENCOUNTER — Encounter: Payer: Self-pay | Admitting: Gastroenterology

## 2022-04-25 ENCOUNTER — Other Ambulatory Visit (HOSPITAL_BASED_OUTPATIENT_CLINIC_OR_DEPARTMENT_OTHER): Payer: Self-pay

## 2022-04-27 ENCOUNTER — Ambulatory Visit (AMBULATORY_SURGERY_CENTER): Payer: No Typology Code available for payment source | Admitting: Gastroenterology

## 2022-04-27 ENCOUNTER — Encounter: Payer: Self-pay | Admitting: Gastroenterology

## 2022-04-27 VITALS — BP 102/62 | HR 58 | Temp 98.0°F | Resp 12 | Ht 64.0 in | Wt 194.0 lb

## 2022-04-27 DIAGNOSIS — D120 Benign neoplasm of cecum: Secondary | ICD-10-CM

## 2022-04-27 DIAGNOSIS — K5730 Diverticulosis of large intestine without perforation or abscess without bleeding: Secondary | ICD-10-CM | POA: Diagnosis not present

## 2022-04-27 DIAGNOSIS — K5900 Constipation, unspecified: Secondary | ICD-10-CM

## 2022-04-27 DIAGNOSIS — Z1211 Encounter for screening for malignant neoplasm of colon: Secondary | ICD-10-CM

## 2022-04-27 MED ORDER — SODIUM CHLORIDE 0.9 % IV SOLN: 500.0000 mL | Freq: Once | INTRAVENOUS | Status: DC

## 2022-04-27 NOTE — Progress Notes (Signed)
PT taken to PACU. Monitors in place. VSS. Report given to RN. 

## 2022-04-27 NOTE — Progress Notes (Signed)
Pt's states no medical or surgical changes since previsit or office visit. 

## 2022-04-27 NOTE — Progress Notes (Signed)
Called to room to assist during endoscopic procedure.  Patient ID and intended procedure confirmed with present staff. Received instructions for my participation in the procedure from the performing physician.  

## 2022-04-27 NOTE — Patient Instructions (Signed)

## 2022-04-27 NOTE — Op Note (Signed)
[facility] Patient Name: Regina Mcconnell Procedure Date: 04/27/2022 8:38 AM MRN: [id number] Endoscopist: Mallie Mussel L. Loletha Carrow , MD Age: 51 Referring MD:  Date of Birth: [birth year] Gender: Female Account #: [id number] Procedure:                Colonoscopy Indications:              Screening for colorectal malignant neoplasm, This                            is the patient's first colonoscopy, Incidental                            constipation noted (since hysterectomy earlier this                            year) Medicines:                Monitored Anesthesia Care Procedure:                Pre-Anesthesia Assessment:                           - Prior to the procedure, a History and Physical                            was performed, and patient medications and                            allergies were reviewed. The patient's tolerance of                            previous anesthesia was also reviewed. The risks                            and benefits of the procedure and the sedation                            options and risks were discussed with the patient.                            All questions were answered, and informed consent                            was obtained. Prior Anticoagulants: The patient has                            taken no previous anticoagulant or antiplatelet                            agents. ASA Grade Assessment: II - A patient with                            mild systemic disease. After reviewing the risks  and benefits, the patient was deemed in                            satisfactory condition to undergo the procedure.                           After obtaining informed consent, the colonoscope                            was passed under direct vision. Throughout the                            procedure, the patient's blood pressure, pulse, and                            oxygen saturations were monitored continuously. The                             Colonoscope was introduced through the anus and                            advanced to the the cecum, identified by                            appendiceal orifice and ileocecal valve. The                            colonoscopy was performed without difficulty. The                            patient tolerated the procedure well. The quality                            of the bowel preparation was fair (esp. proximal                            right colon) despite lavage. The ileocecal valve,                            appendiceal orifice, and rectum were photographed.                            The bowel preparation used was SUPREP. Scope In: 8:45:45 AM Scope Out: 9:04:43 AM Scope Withdrawal Time: 0 hours 15 minutes 40 seconds  Total Procedure Duration: 0 hours 18 minutes 58 seconds  Findings:                 The perianal and digital rectal examinations were                            normal.                           A diminutive polyp was found in the cecum. The  polyp was semi-sessile. The polyp was removed with                            a cold snare. Resection and retrieval were complete.                           Repeat examination of right colon under NBI                            performed.                           Multiple small-mouthed diverticula were found in                            the left colon.                           The exam was otherwise without abnormality on                            direct and retroflexion views. Complications:            No immediate complications. Estimated Blood Loss:     Estimated blood loss was minimal. Impression:               - Preparation of the colon was fair.                           - One diminutive polyp in the cecum, removed with a                            cold snare. Resected and retrieved.                           - Diverticulosis in the left colon.                           -  The examination was otherwise normal on direct                            and retroflexion views. Recommendation:           - Patient has a contact number available for                            emergencies. The signs and symptoms of potential                            delayed complications were discussed with the                            patient. Return to normal activities tomorrow.                            Written discharge instructions were provided to the  patient.                           - Resume previous diet.                           - Continue present medications.                           - Await pathology results.                           - Repeat colonoscopy in 1 year for surveillance                            (see prep quality noted above). Split-dose golytely                            for next exam. Jesiah Grismer L. Loletha Carrow, MD 04/27/2022 9:09:31 AM This report has been signed electronically.

## 2022-04-27 NOTE — Progress Notes (Signed)
History and Physical:  This patient presents for endoscopic testing for: Encounter Diagnosis  Name Primary?   Special screening for malignant neoplasms, colon Yes    Average risk - first screening exam. Constipation this year since a hysterectomy. Patient is otherwise without complaints or active issues today.   Past Medical History: Past Medical History:  Diagnosis Date   Abnormal Pap smear of cervix 1995   --hx colposcopy w/bx and cryotherapy of cervix(paps normal since)   Allergy    seasonal   Blood transfusion without reported diagnosis    during surgery   Heart murmur    as child and young child none since   History of COVID-19 03/2021   diarrhea x 3 days cough /fever x 2 days all symptoms resolved   Pneumonia    03/2020    PONV (postoperative nausea and vomiting)    pt requests scopolamine patch dos   Urinary incontinence    especially with exercise   Wears glasses    or contacts     Past Surgical History: Past Surgical History:  Procedure Laterality Date   BLADDER SUSPENSION N/A 09/12/2021   Procedure: TRANSVAGINAL TAPE (TVT) PROCEDURE;  Surgeon: Jaquita Folds, MD;  Location: [facility];  Service: Gynecology;  Laterality: N/A;   BREAST REDUCTION SURGERY Bilateral 03/03/2020   Procedure: BREAST REDUCTION WITH LIPOSUCTION;  Surgeon: Wallace Going, DO;  Location: [facility];  Service: Plastics;  Laterality: Bilateral;   CYSTOSCOPY N/A 09/12/2021   Procedure: CYSTOSCOPY;  Surgeon: Jaquita Folds, MD;  Location: [facility];  Service: Gynecology;  Laterality: N/A;   DILATION AND CURETTAGE OF UTERUS     due to heavy cycles   ENDOMETRIAL ABLATION  08/29/2007   Dr. Quincy Simmonds   ROBOTIC ASSISTED LAPAROSCOPIC SACROCOLPOPEXY N/A 09/12/2021   Procedure: XI ROBOTIC ASSISTED LAPAROSCOPIC SACROCOLPOPEXY;  Surgeon: Jaquita Folds, MD;  Location: [facility];  Service: Gynecology;  Laterality:  N/A;   ROBOTIC ASSISTED TOTAL HYSTERECTOMY N/A 09/12/2021   Procedure: XI ROBOTIC ASSISTED TOTAL HYSTERECTOMY with bilateral salpingectomy;  Surgeon: Jaquita Folds, MD;  [facility];  Service: Gynecology;  Laterality: N/A;  total time needed 3.5hrs    Allergies: No Known Allergies  Outpatient Meds: Current Outpatient Medications  Medication Sig Dispense Refill   Semaglutide, 1 MG/DOSE, (OZEMPIC, 1 MG/DOSE,) 4 MG/3ML SOPN Inject 1 mg as directed once a week. 9 mL 1   Current Facility-Administered Medications  Medication Dose Route Frequency Provider Last Rate Last Admin   0.9 %  sodium chloride infusion  500 mL Intravenous Once Danis, Estill Cotta III, MD          ___________________________________________________________________ Objective   Exam:  BP 135/87   Pulse 75   Temp 98 F (36.7 C) (Temporal)   Ht '5\' 4"'$  (1.626 m)   Wt 194 lb (88 kg)   SpO2 100%   BMI 33.30 kg/m   CV: RRR without murmur, S1/S2 Resp: clear to auscultation bilaterally, normal RR and effort noted GI: soft, no tenderness, with active bowel sounds.   Assessment: Encounter Diagnosis  Name Primary?   Special screening for malignant neoplasms, colon Yes     Plan: Colonoscopy  The benefits and risks of the planned procedure were described in detail with the patient or (when appropriate) their health care proxy.  Risks were outlined as including, but not limited to, bleeding, infection, perforation, adverse medication reaction leading to cardiac or pulmonary decompensation, pancreatitis (if  ERCP).  The limitation of incomplete mucosal visualization was also discussed.  No guarantees or warranties were given.    The patient is appropriate for an endoscopic procedure in the ambulatory setting.   - Wilfrid Lund, MD

## 2022-04-28 ENCOUNTER — Telehealth: Payer: Self-pay | Admitting: *Deleted

## 2022-04-28 NOTE — Telephone Encounter (Signed)
  Follow up Call-     04/27/2022    8:04 AM  Call back number  Post procedure Call Back phone  # [phone]  Permission to leave phone message Yes     Patient questions:  Do you have a fever, pain , or abdominal swelling? No. Pain Score  0 *  Have you tolerated food without any problems? Yes.    Have you been able to return to your normal activities? Yes.    Do you have any questions about your discharge instructions: Diet   No. Medications  No. Follow up visit  No.  Do you have questions or concerns about your Care? No.  Actions: * If pain score is 4 or above: No action needed, pain <4.

## 2022-05-02 ENCOUNTER — Other Ambulatory Visit (HOSPITAL_BASED_OUTPATIENT_CLINIC_OR_DEPARTMENT_OTHER): Payer: Self-pay

## 2022-05-02 ENCOUNTER — Encounter (HOSPITAL_BASED_OUTPATIENT_CLINIC_OR_DEPARTMENT_OTHER): Payer: Self-pay

## 2022-05-03 ENCOUNTER — Encounter: Payer: Self-pay | Admitting: Gastroenterology

## 2022-05-06 ENCOUNTER — Telehealth: Payer: Self-pay

## 2022-05-06 NOTE — Telephone Encounter (Signed)
Initiated Prior authorization FYT:[id number] (1 MG/DOSE) '4MG'$ /3ML pen-injectors Via: Covermymeds Case/Key:[id number] Status: approved  as of 05/06/22 Reason:The authorization is effective from 05/09/2022 to 05/09/2023 Notified Pt via: Mychart

## 2022-05-08 ENCOUNTER — Encounter: Payer: Self-pay | Admitting: Physician Assistant

## 2022-05-08 DIAGNOSIS — D126 Benign neoplasm of colon, unspecified: Secondary | ICD-10-CM | POA: Insufficient documentation

## 2022-05-11 ENCOUNTER — Other Ambulatory Visit (HOSPITAL_BASED_OUTPATIENT_CLINIC_OR_DEPARTMENT_OTHER): Payer: Self-pay

## 2022-05-22 ENCOUNTER — Other Ambulatory Visit (HOSPITAL_BASED_OUTPATIENT_CLINIC_OR_DEPARTMENT_OTHER): Payer: Self-pay

## 2022-05-22 ENCOUNTER — Other Ambulatory Visit: Payer: Self-pay | Admitting: Physician Assistant

## 2022-05-22 MED ORDER — VALACYCLOVIR HCL 1 G PO TABS
2000.0000 mg | ORAL_TABLET | Freq: Two times a day (BID) | ORAL | 1 refills | Status: DC
Start: 2022-05-22 — End: 2024-02-28
  Filled 2022-05-22: qty 12, 3d supply, fill #0

## 2022-05-22 NOTE — Progress Notes (Signed)
Fever blister outbreak and needs refills. Sent.

## 2022-06-23 ENCOUNTER — Other Ambulatory Visit (HOSPITAL_BASED_OUTPATIENT_CLINIC_OR_DEPARTMENT_OTHER): Payer: Self-pay

## 2022-06-23 MED ORDER — INFLUENZA VAC SPLIT QUAD 0.5 ML IM SUSY
PREFILLED_SYRINGE | INTRAMUSCULAR | 0 refills | Status: DC
  Filled 2022-06-23: qty 0.5, 1d supply, fill #0

## 2022-07-31 ENCOUNTER — Other Ambulatory Visit (HOSPITAL_BASED_OUTPATIENT_CLINIC_OR_DEPARTMENT_OTHER): Payer: Self-pay

## 2022-08-01 ENCOUNTER — Other Ambulatory Visit (HOSPITAL_BASED_OUTPATIENT_CLINIC_OR_DEPARTMENT_OTHER): Payer: Self-pay

## 2022-09-20 ENCOUNTER — Other Ambulatory Visit (HOSPITAL_BASED_OUTPATIENT_CLINIC_OR_DEPARTMENT_OTHER): Payer: Self-pay

## 2022-09-25 ENCOUNTER — Other Ambulatory Visit (HOSPITAL_BASED_OUTPATIENT_CLINIC_OR_DEPARTMENT_OTHER): Payer: Self-pay

## 2022-09-25 ENCOUNTER — Other Ambulatory Visit: Payer: Self-pay | Admitting: Physician Assistant

## 2022-09-25 MED ORDER — PREDNISONE 50 MG PO TABS
ORAL_TABLET | ORAL | 0 refills | Status: DC
  Filled 2022-09-25: qty 5, 5d supply, fill #0

## 2022-09-25 NOTE — Progress Notes (Signed)
Hx of lumbar spinal stenosis and having a flare.  Sent prednisone for 5 days for flare. Continue with flexeril. Follow up as needed.

## 2022-10-04 ENCOUNTER — Other Ambulatory Visit: Payer: Self-pay | Admitting: Physician Assistant

## 2022-10-04 ENCOUNTER — Ambulatory Visit (INDEPENDENT_AMBULATORY_CARE_PROVIDER_SITE_OTHER): Payer: 59 | Admitting: Physician Assistant

## 2022-10-04 DIAGNOSIS — M48061 Spinal stenosis, lumbar region without neurogenic claudication: Secondary | ICD-10-CM | POA: Diagnosis not present

## 2022-10-04 MED ORDER — KETOROLAC TROMETHAMINE 60 MG/2ML IM SOLN
60.0000 mg | Freq: Once | INTRAMUSCULAR | Status: AC
  Administered 2022-10-04: 60 mg via INTRAMUSCULAR

## 2022-10-04 NOTE — Progress Notes (Signed)
Reviewed past notes from Dr. Darene Lamer. Toradol '60mg'$  given IM today. Pt finished prednisone burst. Discussed management with core exercises. Will refer to another round of PT. Follow up with Dr. Darene Lamer to consider epidural injections.

## 2022-10-04 NOTE — Progress Notes (Signed)
   Established Patient Office Visit  Subjective   Patient ID: Regina Mcconnell, female    DOB: 1971/03/07  Age: 52 y.o. MRN: [id number]  Chief Complaint  Patient presents with   Back Pain    HPI  Regina Mcconnell is here for Toradol injection due to low back pain.   ROS    Objective:     There were no vitals taken for this visit.   Physical Exam   No results found for any visits on 10/04/22.    The 10-year ASCVD risk score (Arnett DK, et al., 2019) is: 1%    Assessment & Plan:  Toradol injection - Patient tolerated injection well without complications.    Problem List Items Addressed This Visit       Unprioritized   Lumbar spinal stenosis - Primary    Return if symptoms worsen or fail to improve.    Lavell Luster, [place]

## 2022-10-09 ENCOUNTER — Encounter: Payer: Self-pay | Admitting: Rehabilitative and Restorative Service Providers"

## 2022-10-09 ENCOUNTER — Ambulatory Visit: Payer: 59 | Attending: Physician Assistant | Admitting: Rehabilitative and Restorative Service Providers"

## 2022-10-09 ENCOUNTER — Other Ambulatory Visit: Payer: Self-pay

## 2022-10-09 DIAGNOSIS — M48061 Spinal stenosis, lumbar region without neurogenic claudication: Secondary | ICD-10-CM | POA: Diagnosis not present

## 2022-10-09 DIAGNOSIS — R293 Abnormal posture: Secondary | ICD-10-CM | POA: Diagnosis not present

## 2022-10-09 DIAGNOSIS — M545 Low back pain, unspecified: Secondary | ICD-10-CM

## 2022-10-09 DIAGNOSIS — M6281 Muscle weakness (generalized): Secondary | ICD-10-CM

## 2022-10-09 NOTE — Therapy (Signed)
OUTPATIENT PHYSICAL THERAPY THORACOLUMBAR EVALUATION   Patient Name: Regina Mcconnell MRN: [id number] DOB:[birth year], 52 y.o., female Today's Date: 10/09/2022  END OF SESSION:  PT End of Session - 10/09/22 0720     Visit Number 1    Number of Visits 16    Date for PT Re-Evaluation 12/04/22    PT Start Time 0716    PT Stop Time 0810    PT Time Calculation (min) 54 min    Activity Tolerance Patient tolerated treatment well             Past Medical History:  Diagnosis Date   Abnormal Pap smear of cervix 1995   --hx colposcopy w/bx and cryotherapy of cervix(paps normal since)   Allergy    seasonal   Blood transfusion without reported diagnosis    during surgery   Heart murmur    as child and young child none since   History of COVID-19 03/2021   diarrhea x 3 days cough /fever x 2 days all symptoms resolved   Pneumonia    03/2020    PONV (postoperative nausea and vomiting)    pt requests scopolamine patch dos   Urinary incontinence    especially with exercise   Wears glasses    or contacts   Past Surgical History:  Procedure Laterality Date   BLADDER SUSPENSION N/A 09/12/2021   Procedure: TRANSVAGINAL TAPE (TVT) PROCEDURE;  Surgeon: Jaquita Folds, MD;  Location: [place];  Service: Gynecology;  Laterality: N/A;   BREAST REDUCTION SURGERY Bilateral 03/03/2020   Procedure: BREAST REDUCTION WITH LIPOSUCTION;  Surgeon: Wallace Going, DO;  Location: [place];  Service: Plastics;  Laterality: Bilateral;   CYSTOSCOPY N/A 09/12/2021   Procedure: CYSTOSCOPY;  Surgeon: Jaquita Folds, MD;  Location: [facility];  Service: Gynecology;  Laterality: N/A;   DILATION AND CURETTAGE OF UTERUS     due to heavy cycles   ENDOMETRIAL ABLATION  08/29/2007   Dr. Quincy Simmonds   ROBOTIC ASSISTED LAPAROSCOPIC SACROCOLPOPEXY N/A 09/12/2021   Procedure: XI ROBOTIC ASSISTED LAPAROSCOPIC SACROCOLPOPEXY;  Surgeon: Jaquita Folds,  MD;  Location: [facility];  Service: Gynecology;  Laterality: N/A;   ROBOTIC ASSISTED TOTAL HYSTERECTOMY N/A 09/12/2021   Procedure: XI ROBOTIC ASSISTED TOTAL HYSTERECTOMY with bilateral salpingectomy;  Surgeon: Jaquita Folds, MD;  Location: [facility];  Service: Gynecology;  Laterality: N/A;  total time needed 3.5hrs   Patient Active Problem List   Diagnosis Date Noted   Adenomatous colon polyp 05/08/2022   Elevated serum creatinine 04/18/2022   Class 1 obesity due to excess calories without serious comorbidity with body mass index (BMI) of 33.0 to 33.9 in adult 04/17/2022   Uterovaginal prolapse, incomplete 09/12/2021   Left lower quadrant pain 06/14/2021   Nausea 06/14/2021   Elevated LDL cholesterol level 02/24/2021   S/P bilateral breast reduction 03/26/2020   Lumbar spinal stenosis 10/27/2019   Hyperlipidemia LDL goal <130 05/01/2019   Elevated fasting glucose 05/01/2019   Vitamin D insufficiency 04/30/2019   Incomplete uterovaginal prolapse 04/01/2019   Stress incontinence 04/01/2019   Morbid obesity ([place]) 01/01/2019   Symptomatic mammary hypertrophy 01/01/2019   B12 nutritional deficiency 02/06/2018   Class 3 severe obesity due to excess calories without serious comorbidity with body mass index (BMI) of 40.0 to 44.9 in adult ([place]) 02/05/2018   Pulmonary nodule 06/19/2017   Elevated liver enzymes 05/23/2017   Pain of upper abdomen 05/23/2017   Spleen enlarged  05/23/2017   Poison ivy dermatitis 01/02/2013   Family history of early CAD 07/23/2012   Family history of diabetes mellitus 07/23/2012   Heart murmur 07/23/2012    PCP: Iran Planas, PA-C  REFERRING PROVIDER: Elberta Fortis  REFERRING DIAG: Low back pain   Rationale for Evaluation and Treatment: Rehabilitation  THERAPY DIAG:  Acute midline low back pain without sciatica  Abnormal posture  Muscle weakness (generalized)  ONSET DATE: 09/21/22  SUBJECTIVE:                                                                                                                                                                                            SUBJECTIVE STATEMENT: Patient reports that she had increased sudden LBP ~ 2 weeks ago with no known injury. She has received a steroid injection and steroid dose pac with some improvement.   PERTINENT HISTORY:  Recurrent LBP for the past 2-3 years; cervical DDD  PAIN:  Are you having pain? Yes: NPRS scale: 4/10 Pain location: bilat low back  Pain description: discomfort  Aggravating factors: sitting for a while then standing Relieving factors: movement   PRECAUTIONS: None  WEIGHT BEARING RESTRICTIONS: No  FALLS:  Has patient fallen in last 6 months? 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; [employer]    PLOF: Independent  PATIENT GOALS: get rid of the pain   NEXT MD VISIT: as needed   OBJECTIVE:   DIAGNOSTIC FINDINGS:  None available   PATIENT SURVEYS:  FOTO 48; goal 57  SCREENING FOR RED FLAGS: Bowel or bladder incontinence: No Spinal tumors: No Cauda equina syndrome: No Compression fracture: No Abdominal aneurysm: No  COGNITION: Overall cognitive status: Within functional limits for tasks assessed     SENSATION: WFL  MUSCLE LENGTH: Hamstrings: Right 60 deg; Left 60 deg Hip flexors - tight bilat Rt > Lt   POSTURE: rounded shoulders, forward head, decreased lumbar lordosis, increased thoracic kyphosis, and flexed trunk   PALPATION: Muscular tightness Rt > Lt lumbar paraspinals; QL; piriformis; gluts   LUMBAR ROM:   AROM eval  Flexion 60%  Extension 20%  Right lateral flexion 75%  Left lateral flexion 75%  Right rotation 25%  Left rotation 25%   (Blank rows = not tested)  LOWER EXTREMITY ROM:     Active  Right eval Left eval  Hip flexion    Hip extension tight  Tight   Hip abduction    Hip adduction    Hip internal rotation    Hip external rotation  Knee flexion    Knee extension    Ankle dorsiflexion    Ankle plantarflexion    Ankle inversion    Ankle eversion     (Blank rows = not tested)  LOWER EXTREMITY MMT:  WNL's throughout LE's - weak core    LUMBAR SPECIAL TESTS:  Straight leg raise test: Negative and Slump test: Negative   TODAY'S TREATMENT:                                                                                                                              [facility] Adult PT Treatment:                                                DATE: 10/09/22 Therapeutic Exercise: Prone press up 2 sec x 10  Manual Therapy: Skilled palpation to assess response to DN and manual work  STM - Rt > Lt lumbar musculature into Rt posterior hip  Trigger Point Dry-Needling  Treatment instructions: Expect mild to moderate muscle soreness. S/S of pneumothorax if dry needled over a lung field, and to seek immediate medical attention should they occur. Patient verbalized understanding of these instructions and education.  Patient Consent Given: Yes Education handout provided: Yes Muscles treated: Rt piriformis, glut med, glut max; QL; lumbar paraspinals  Electrical stimulation performed: No Parameters: N/A Treatment response/outcome: decreased palpable tightness   Neuromuscular re-ed:  Therapeutic Activity: Education re-core strength and importance of gaining strength in core to improve [place] health Gait: WFL's - flexed forward at hips/trunk Modalities: TENs Rt posterior hip Rt lumbar spine x 15 min Moist heat x 15 min  Self Care: Avoid sitting with legs crossed     PATIENT EDUCATION:  Education details: POC HEP Person educated: Patient Education method: Explanation, Demonstration, Tactile cues, Verbal cues, and Handouts Education comprehension: verbalized understanding, returned demonstration, verbal cues required, tactile cues  required, and needs further education  HOME EXERCISE PROGRAM: Access Code: [email or web address] URL: [email or web address] Date: 10/09/2022 Prepared by: Gillermo Murdoch  Exercises - Prone Press Up  - 2 x daily - 7 x weekly - 1 sets - 10 reps - 2-3 sec  hold - Standing Lumbar Extension  - 2 x daily - 7 x weekly - 1 sets - 2-3 reps - 2-3 sec  hold - Supine Transversus Abdominis Bracing with Pelvic Floor Contraction  - 2 x daily - 7 x weekly - 1 sets - 10 reps - 10sec  hold - Standing Bilateral Low Shoulder Row with Anchored Resistance  - 2 x daily - 7 x weekly - 1-3 sets - 10 reps - 2-3 sec  hold - Anti-Rotation Lateral Stepping with Press  - 2 x daily - 7 x weekly - 1-2 sets - 10 reps - 2-3 sec  hold  Patient Education - Office Posture  ASSESSMENT:  CLINICAL IMPRESSION: Patient is a 52 y.o. female who was seen today for physical therapy evaluation and treatment for recurrent LBP Rt > Lt with sudden onset of pain ~ 2 weeks ago with no known injury. She has a history of LBP with bulging disc in lumbar spine . She has limited trunk ROM; core weakness; abnormal gait with forward flexed posture; limited functional activity level and pain on a constant basis. Patient will benefit from PT to address problems identified.    OBJECTIVE IMPAIRMENTS: decreased activity tolerance, decreased endurance, decreased mobility, decreased ROM, decreased strength, hypomobility, increased fascial restrictions, impaired flexibility, improper body mechanics, postural dysfunction, and pain.   ACTIVITY LIMITATIONS: carrying, lifting, sitting, standing, squatting, and transfers  PARTICIPATION LIMITATIONS: driving and occupation  PERSONAL FACTORS: Behavior pattern, Fitness, Past/current experiences, and 1-2 comorbidities: HNP lumbar spine; weak core  are also affecting patient's functional outcome.   REHAB POTENTIAL: Good  CLINICAL DECISION MAKING: Stable/uncomplicated  EVALUATION COMPLEXITY:  Low   GOALS: Goals reviewed with patient? Yes  SHORT TERM GOALS: Target date: 11/06/2022   Independent in initial HEP  Baseline: Goal status: INITIAL  2.  Instruct in body mechanics and back care principles Baseline:  Goal status: INITIAL  LONG TERM GOALS: Target date: 12/04/2022  Decrease back pain by 75-100% Baseline:  Goal status: INITIAL  2.  Improve core strength and stability for patient to move sit to stand and stand to sit without pain Baseline:  Goal status: INITIAL  3.  Improve core strength and stability allowing patient to complete work day with no increase pain > 1-2/10  Baseline:  Goal status: INITIAL  4.  Independent in HEP  Baseline:  Goal status: INITIAL  5.  Improve functional limitation score to 69 Baseline: 48 Goal status: INITIAL  PLAN:  PT FREQUENCY: 1-2x/week  PT DURATION: 8 weeks  PLANNED INTERVENTIONS: Therapeutic exercises, Therapeutic activity, Neuromuscular re-education, Balance training, Gait training, Patient/Family education, Self Care, Joint mobilization, Aquatic Therapy, Dry Needling, Electrical stimulation, Cryotherapy, Moist heat, Ultrasound, Ionotophoresis 60m/ml Dexamethasone, Manual therapy, and Re-evaluation.  PLAN FOR NEXT SESSION: review and progress HEP; continue back education; manual work, DN, modalities as indicated    [employer] PT 10/09/2022, 10:18 AM

## 2022-10-16 ENCOUNTER — Ambulatory Visit: Payer: 59 | Admitting: Rehabilitative and Restorative Service Providers"

## 2022-10-16 ENCOUNTER — Encounter: Payer: Self-pay | Admitting: Rehabilitative and Restorative Service Providers"

## 2022-10-16 DIAGNOSIS — M6281 Muscle weakness (generalized): Secondary | ICD-10-CM

## 2022-10-16 DIAGNOSIS — R293 Abnormal posture: Secondary | ICD-10-CM

## 2022-10-16 DIAGNOSIS — M545 Low back pain, unspecified: Secondary | ICD-10-CM

## 2022-10-16 DIAGNOSIS — M48061 Spinal stenosis, lumbar region without neurogenic claudication: Secondary | ICD-10-CM | POA: Diagnosis not present

## 2022-10-16 NOTE — Therapy (Signed)
OUTPATIENT PHYSICAL THERAPY THORACOLUMBAR TREATMENT   Patient Name: Regina Mcconnell MRN: [id number] DOB:[birth year], 52 y.o., female Today's Date: 10/16/2022  END OF SESSION:  PT End of Session - 10/16/22 1450     Visit Number 2    Number of Visits 16    Date for PT Re-Evaluation 12/04/22    PT Start Time 1446    PT Stop Time 1530    PT Time Calculation (min) 44 min    Activity Tolerance Patient tolerated treatment well             Past Medical History:  Diagnosis Date   Abnormal Pap smear of cervix 1995   --hx colposcopy w/bx and cryotherapy of cervix(paps normal since)   Allergy    seasonal   Blood transfusion without reported diagnosis    during surgery   Heart murmur    as child and young child none since   History of COVID-19 03/2021   diarrhea x 3 days cough /fever x 2 days all symptoms resolved   Pneumonia    03/2020    PONV (postoperative nausea and vomiting)    pt requests scopolamine patch dos   Urinary incontinence    especially with exercise   Wears glasses    or contacts   Past Surgical History:  Procedure Laterality Date   BLADDER SUSPENSION N/A 09/12/2021   Procedure: TRANSVAGINAL TAPE (TVT) PROCEDURE;  Surgeon: Jaquita Folds, MD;  Location: [place];  Service: Gynecology;  Laterality: N/A;   BREAST REDUCTION SURGERY Bilateral 03/03/2020   Procedure: BREAST REDUCTION WITH LIPOSUCTION;  Surgeon: Wallace Going, DO;  Location: [place];  Service: Plastics;  Laterality: Bilateral;   CYSTOSCOPY N/A 09/12/2021   Procedure: CYSTOSCOPY;  Surgeon: Jaquita Folds, MD;  Location: [facility];  Service: Gynecology;  Laterality: N/A;   DILATION AND CURETTAGE OF UTERUS     due to heavy cycles   ENDOMETRIAL ABLATION  08/29/2007   Dr. Quincy Simmonds   ROBOTIC ASSISTED LAPAROSCOPIC SACROCOLPOPEXY N/A 09/12/2021   Procedure: XI ROBOTIC ASSISTED LAPAROSCOPIC SACROCOLPOPEXY;  Surgeon: Jaquita Folds,  MD;  Location: [facility];  Service: Gynecology;  Laterality: N/A;   ROBOTIC ASSISTED TOTAL HYSTERECTOMY N/A 09/12/2021   Procedure: XI ROBOTIC ASSISTED TOTAL HYSTERECTOMY with bilateral salpingectomy;  Surgeon: Jaquita Folds, MD;  Location: [facility];  Service: Gynecology;  Laterality: N/A;  total time needed 3.5hrs   Patient Active Problem List   Diagnosis Date Noted   Adenomatous colon polyp 05/08/2022   Elevated serum creatinine 04/18/2022   Class 1 obesity due to excess calories without serious comorbidity with body mass index (BMI) of 33.0 to 33.9 in adult 04/17/2022   Uterovaginal prolapse, incomplete 09/12/2021   Left lower quadrant pain 06/14/2021   Nausea 06/14/2021   Elevated LDL cholesterol level 02/24/2021   S/P bilateral breast reduction 03/26/2020   Lumbar spinal stenosis 10/27/2019   Hyperlipidemia LDL goal <130 05/01/2019   Elevated fasting glucose 05/01/2019   Vitamin D insufficiency 04/30/2019   Incomplete uterovaginal prolapse 04/01/2019   Stress incontinence 04/01/2019   Morbid obesity ([place]) 01/01/2019   Symptomatic mammary hypertrophy 01/01/2019   B12 nutritional deficiency 02/06/2018   Class 3 severe obesity due to excess calories without serious comorbidity with body mass index (BMI) of 40.0 to 44.9 in adult ([name]) 02/05/2018   Pulmonary nodule 06/19/2017   Elevated liver enzymes 05/23/2017   Pain of upper abdomen 05/23/2017   Spleen enlarged  05/23/2017   Poison ivy dermatitis 01/02/2013   Family history of early CAD 07/23/2012   Family history of diabetes mellitus 07/23/2012   Heart murmur 07/23/2012    PCP: Iran Planas, PA-C  REFERRING PROVIDER: Elberta Fortis  REFERRING DIAG: Low back pain   Rationale for Evaluation and Treatment: Rehabilitation  THERAPY DIAG:  Acute midline low back pain without sciatica  Abnormal posture  Muscle weakness (generalized)  ONSET DATE: 09/21/22  SUBJECTIVE:                                                                                                                                                                                            SUBJECTIVE STATEMENT:  Felt looser following DN at last visit. Tightened up a couple of days later but she did not do anything crazy over the weekend so she is feeling ok today.   PERTINENT HISTORY:  Patient reports that she had increased sudden LBP ~ 2 weeks ago with no known injury. She has received a steroid injection and steroid dose pac with some improvement.Recurrent LBP for the past 2-3 years; cervical DDD  PAIN:  Are you having pain? Yes: NPRS scale: 0/10 Pain location: bilat low back  Pain description: discomfort  Aggravating factors: sitting for a while then standing Relieving factors: movement   PRECAUTIONS: None  WEIGHT BEARING RESTRICTIONS: No  FALLS:  Has patient fallen in last 6 months? 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; [employer]    PLOF: Independent  PATIENT GOALS: get rid of the pain   NEXT MD VISIT: as needed   OBJECTIVE:   DIAGNOSTIC FINDINGS:  None available   PATIENT SURVEYS:  FOTO 48; goal 44  SCREENING FOR RED FLAGS: Bowel or bladder incontinence: No Spinal tumors: No Cauda equina syndrome: No Compression fracture: No Abdominal aneurysm: No  COGNITION: Overall cognitive status: Within functional limits for tasks assessed     SENSATION: WFL  MUSCLE LENGTH: Hamstrings: Right 60 deg; Left 60 deg Hip flexors - tight bilat Rt > Lt   POSTURE: rounded shoulders, forward head, decreased lumbar lordosis, increased thoracic kyphosis, and flexed trunk   PALPATION: Muscular tightness Rt > Lt lumbar paraspinals; QL; piriformis; gluts   LUMBAR ROM:   AROM eval  Flexion 60%  Extension 20%  Right lateral flexion 75%  Left lateral flexion 75%  Right  rotation 25%  Left rotation 25%   (Blank rows = not tested)  LOWER EXTREMITY ROM:     Active  Right eval Left eval  Hip flexion  Hip extension tight Tight   Hip abduction    Hip adduction    Hip internal rotation    Hip external rotation    Knee flexion    Knee extension    Ankle dorsiflexion    Ankle plantarflexion    Ankle inversion    Ankle eversion     (Blank rows = not tested)  LOWER EXTREMITY MMT:  WNL's throughout LE's - weak core    LUMBAR SPECIAL TESTS:  Straight leg raise test: Negative and Slump test: Negative   TODAY'S TREATMENT:                                                                                                                              [facility] Adult PT Treatment:                                                DATE: 10/16/22 Therapeutic Exercise: Nustep L7 x 5 min UE 9  Prone press up 2 sec x 10  4 part core 10 sec x 10 through exercises  Piriformis stretch 30 sec x 3  Sit to stand hinged hip x 10  Row blue TB 3 sec hold x 10  Shoulder extension blue TB 3 sec hold x 10  Antirotation green TB 3 sec hold x 10  Manual Therapy: Skilled palpation to assess response to DN and manual work  STM - Rt > Lt lumbar musculature into Rt posterior hip  Trigger Point Dry-Needling  Treatment instructions: Expect mild to moderate muscle soreness. S/S of pneumothorax if dry needled over a lung field, and to seek immediate medical attention should they occur. Patient verbalized understanding of these instructions and education. Patient Consent Given: Yes Education handout provided: Yes Muscles treated: Rt piriformis, glut med, glut max; lumbar paraspinals  Electrical stimulation performed: Yes Parameters: mAmp current to pt tolerance  Treatment response/outcome: decreased palpable tightness   Neuromuscular re-ed:  Therapeutic Activity: Education re-core strength and importance of gaining strength in core to improve [place] health Gait: WFL's - flexed  forward at hips/trunk Modalities: Will use TENS and heat at home Self Care: Avoid sitting with legs crossed  Myofacial ball release work supine and standing piriformis   [facility] Adult PT Treatment:                                                DATE: 10/09/22 Therapeutic Exercise: Prone press up 2 sec x 10  Manual Therapy: Skilled palpation to assess response to DN and manual work  STM - Rt > Lt lumbar musculature into Rt posterior hip  Trigger Point Dry-Needling  Treatment instructions: Expect mild to moderate muscle soreness. S/S of  pneumothorax if dry needled over a lung field, and to seek immediate medical attention should they occur. Patient verbalized understanding of these instructions and education.  Patient Consent Given: Yes Education handout provided: Yes Muscles treated: Rt piriformis, glut med, glut max; QL; lumbar paraspinals  Electrical stimulation performed: No Parameters: N/A Treatment response/outcome: decreased palpable tightness   Neuromuscular re-ed:  Therapeutic Activity: Education re-core strength and importance of gaining strength in core to improve [place] health Gait: WFL's - flexed forward at hips/trunk Modalities: TENs Rt posterior hip Rt lumbar spine x 15 min Moist heat x 15 min  Self Care: Avoid sitting with legs crossed     PATIENT EDUCATION:  Education details: POC HEP Person educated: Patient Education method: Explanation, Demonstration, Tactile cues, Verbal cues, and Handouts Education comprehension: verbalized understanding, returned demonstration, verbal cues required, tactile cues required, and needs further education  HOME EXERCISE PROGRAM: Access Code: [email or web address] URL: [email or web address] Date: 10/16/2022 Prepared by: Gillermo Murdoch  Exercises - Prone Press Up  - 2 x daily - 7 x weekly - 1 sets - 10 reps - 2-3 sec  hold - Standing Lumbar Extension  - 2 x daily - 7 x weekly - 1 sets - 2-3 reps - 2-3 sec  hold - Supine  Transversus Abdominis Bracing with Pelvic Floor Contraction  - 2 x daily - 7 x weekly - 1 sets - 10 reps - 10sec  hold - Supine Piriformis Stretch with Leg Straight  - 2 x daily - 7 x weekly - 1 sets - 3 reps - 30 sec  hold - Sit to Stand  - 2 x daily - 7 x weekly - 1 sets - 10 reps - 3-5 sec  hold - Standing Bilateral Low Shoulder Row with Anchored Resistance  - 2 x daily - 7 x weekly - 1-3 sets - 10 reps - 2-3 sec  hold - Shoulder extension with resistance - Neutral  - 1 x daily - 7 x weekly - 1-2 sets - 10 reps - 3-5 sec  hold - Anti-Rotation Lateral Stepping with Press  - 2 x daily - 7 x weekly - 1-2 sets - 10 reps - 2-3 sec  hold Patient Education - Office Posture  ASSESSMENT:  CLINICAL IMPRESSION: Patient reports good improvement in LBP and Rt posterior hip pain. Patient has less palpable tightness in the posterior Rt hip and LB. Tolerated new exercises and myofacial ball release well.  Try hip flexor stretch and modified dead lift at next visit    OBJECTIVE IMPAIRMENTS: recurrent LBP Rt > Lt with sudden onset of pain ~ 2 weeks ago with no known injury. She has a history of LBP with bulging disc in lumbar spine . She has limited trunk ROM; core weakness; abnormal gait with forward flexed posture; limited functional activity level and pain on a constant basis.decreased activity tolerance, decreased endurance, decreased mobility, decreased ROM, decreased strength, hypomobility, increased fascial restrictions, impaired flexibility, improper body mechanics, postural dysfunction, and pain.   ACTIVITY LIMITATIONS: carrying, lifting, sitting, standing, squatting, and transfers  PARTICIPATION LIMITATIONS: driving and occupation  PERSONAL FACTORS: Behavior pattern, Fitness, Past/current experiences, and 1-2 comorbidities: HNP lumbar spine; weak core  are also affecting patient's functional outcome.   REHAB POTENTIAL: Good  CLINICAL DECISION MAKING: Stable/uncomplicated  EVALUATION COMPLEXITY:  Low   GOALS: Goals reviewed with patient? Yes  SHORT TERM GOALS: Target date: 11/06/2022   Independent in initial HEP  Baseline: Goal status: INITIAL  2.  Instruct in body mechanics and  back care principles Baseline:  Goal status: INITIAL  LONG TERM GOALS: Target date: 12/04/2022  Decrease back pain by 75-100% Baseline:  Goal status: INITIAL  2.  Improve core strength and stability for patient to move sit to stand and stand to sit without pain Baseline:  Goal status: INITIAL  3.  Improve core strength and stability allowing patient to complete work day with no increase pain > 1-2/10  Baseline:  Goal status: INITIAL  4.  Independent in HEP  Baseline:  Goal status: INITIAL  5.  Improve functional limitation score to 69 Baseline: 48 Goal status: INITIAL  PLAN:  PT FREQUENCY: 1-2x/week  PT DURATION: 8 weeks  PLANNED INTERVENTIONS: Therapeutic exercises, Therapeutic activity, Neuromuscular re-education, Balance training, Gait training, Patient/Family education, Self Care, Joint mobilization, Aquatic Therapy, Dry Needling, Electrical stimulation, Cryotherapy, Moist heat, Ultrasound, Ionotophoresis 37m/ml Dexamethasone, Manual therapy, and Re-evaluation.  PLAN FOR NEXT SESSION: review and progress HEP; continue back education; manual work, DN, modalities as indicated    [employer] PT 10/16/2022, 3:33 PM

## 2022-10-30 ENCOUNTER — Ambulatory Visit: Payer: 59 | Attending: Physician Assistant | Admitting: Rehabilitative and Restorative Service Providers"

## 2022-10-30 ENCOUNTER — Encounter: Payer: Self-pay | Admitting: Rehabilitative and Restorative Service Providers"

## 2022-10-30 DIAGNOSIS — M545 Low back pain, unspecified: Secondary | ICD-10-CM | POA: Diagnosis not present

## 2022-10-30 DIAGNOSIS — M6281 Muscle weakness (generalized): Secondary | ICD-10-CM | POA: Insufficient documentation

## 2022-10-30 DIAGNOSIS — R293 Abnormal posture: Secondary | ICD-10-CM | POA: Diagnosis not present

## 2022-10-30 NOTE — Therapy (Addendum)
OUTPATIENT PHYSICAL THERAPY THORACOLUMBAR TREATMENT AND DISCHARGE SUMMARY   PHYSICAL THERAPY DISCHARGE SUMMARY  Visits from Start of Care: 3  Current functional level related to goals / functional outcomes: See progress note for discharge status    Remaining deficits: Needs to continue with core stabilization program   Education / Equipment: HEP    Patient agrees to discharge. Patient goals were partially met. Patient is being discharged due to being pleased with the current functional level. Regina Mcconnell P. Leonor Liv PT, MPH 12/20/22 4:15 PM   Patient Name: Regina Mcconnell MRN: [id number] DOB:[birth year], 52 y.o., female Today's Date: 10/30/2022  END OF SESSION:  PT End of Session - 10/30/22 0802     Visit Number 3    Number of Visits 16    Date for PT Re-Evaluation 12/04/22    PT Start Time 0800    PT Stop Time 0845    PT Time Calculation (min) 45 min             Past Medical History:  Diagnosis Date   Abnormal Pap smear of cervix 1995   --hx colposcopy w/bx and cryotherapy of cervix(paps normal since)   Allergy    seasonal   Blood transfusion without reported diagnosis    during surgery   Heart murmur    as child and young child none since   History of COVID-19 03/2021   diarrhea x 3 days cough /fever x 2 days all symptoms resolved   Pneumonia    03/2020    PONV (postoperative nausea and vomiting)    pt requests scopolamine patch dos   Urinary incontinence    especially with exercise   Wears glasses    or contacts   Past Surgical History:  Procedure Laterality Date   BLADDER SUSPENSION N/A 09/12/2021   Procedure: TRANSVAGINAL TAPE (TVT) PROCEDURE;  Surgeon: Marguerita Beards, MD;  Location: [facility];  Service: Gynecology;  Laterality: N/A;   BREAST REDUCTION SURGERY Bilateral 03/03/2020   Procedure: BREAST REDUCTION WITH LIPOSUCTION;  Surgeon: Peggye Form, DO;  [facility]:  [facility];  Service: Plastics;  Laterality:  Bilateral;   CYSTOSCOPY N/A 09/12/2021   Procedure: CYSTOSCOPY;  Surgeon: Marguerita Beards, MD;  Location: [facility];  Service: Gynecology;  Laterality: N/A;   DILATION AND CURETTAGE OF UTERUS     due to heavy cycles   ENDOMETRIAL ABLATION  08/29/2007   Dr. Edward Jolly   ROBOTIC ASSISTED LAPAROSCOPIC SACROCOLPOPEXY N/A 09/12/2021   Procedure: XI ROBOTIC ASSISTED LAPAROSCOPIC SACROCOLPOPEXY;  Surgeon: Marguerita Beards, MD;  Location: [facility];  Service: Gynecology;  Laterality: N/A;   ROBOTIC ASSISTED TOTAL HYSTERECTOMY N/A 09/12/2021   Procedure: XI ROBOTIC ASSISTED TOTAL HYSTERECTOMY with bilateral salpingectomy;  Surgeon: Marguerita Beards, MD;  Location[facility];  Service: Gynecology;  Laterality: N/A;  total time needed 3.5hrs   Patient Active Problem List   Diagnosis Date Noted   Adenomatous colon polyp 05/08/2022   Elevated serum creatinine 04/18/2022   Class 1 obesity due to excess calories without serious comorbidity with body mass index (BMI) of 33.0 to 33.9 in adult 04/17/2022   Uterovaginal prolapse, incomplete 09/12/2021   Left lower quadrant pain 06/14/2021   Nausea 06/14/2021   Elevated LDL cholesterol level 02/24/2021   S/P bilateral breast reduction 03/26/2020   Lumbar spinal stenosis 10/27/2019   Hyperlipidemia LDL goal <130 05/01/2019   Elevated fasting glucose 05/01/2019   Vitamin D insufficiency 04/30/2019   Incomplete  uterovaginal prolapse 04/01/2019   Stress incontinence 04/01/2019   Morbid obesity (HCC) 01/01/2019   Symptomatic mammary hypertrophy 01/01/2019   B12 nutritional deficiency 02/06/2018   Class 3 severe obesity due to excess calories without serious comorbidity with body mass index (BMI) of 40.0 to 44.9 in adult (HCC) 02/05/2018   Pulmonary nodule 06/19/2017   Elevated liver enzymes 05/23/2017   Pain of upper abdomen 05/23/2017   Spleen enlarged 05/23/2017   Poison ivy dermatitis  01/02/2013   Family history of early CAD 07/23/2012   Family history of diabetes mellitus 07/23/2012   Heart murmur 07/23/2012    PCP: Tandy Gaw, PA-C  REFERRING PROVIDER: Delaney Meigs  REFERRING DIAG: Low back pain   Rationale for Evaluation and Treatment: Rehabilitation  THERAPY DIAG:  Acute midline low back pain without sciatica  Abnormal posture  Muscle weakness (generalized)  ONSET DATE: 09/21/22  SUBJECTIVE:                                                                                                                                                                                           SUBJECTIVE STATEMENT:  Feeling looser. Unable to find time to do exercises consistently. No pain.   PERTINENT HISTORY:  Patient reports that she had increased sudden LBP ~ 2 weeks ago with no known injury. She has received a steroid injection and steroid dose pac with some improvement.Recurrent LBP for the past 2-3 years; cervical DDD  PAIN:  Are you having pain? Yes: NPRS scale: 0/10 Pain location: bilat low back  Pain description: discomfort  Aggravating factors: sitting for a while then standing Relieving factors: movement   PRECAUTIONS: None  WEIGHT BEARING RESTRICTIONS: No  FALLS:  Has patient fallen in last 6 months? 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; [employer]    PLOF: Independent  PATIENT GOALS: get rid of the pain   NEXT MD VISIT: as needed   OBJECTIVE:   DIAGNOSTIC FINDINGS:  None available   PATIENT SURVEYS:  FOTO 48; goal 19   MUSCLE LENGTH: Hamstrings: Right 60 deg; Left 60 deg Hip flexors - tight bilat Rt > Lt   POSTURE: rounded shoulders, forward head, decreased lumbar lordosis, increased thoracic kyphosis, and flexed trunk   PALPATION: Muscular tightness Rt > Lt lumbar paraspinals; QL; piriformis; gluts   LUMBAR  ROM:   AROM eval  Flexion 60%  Extension 20%  Right lateral flexion 75%  Left lateral flexion 75%  Right rotation 25%  Left rotation 25%   (Blank rows = not tested)  LOWER EXTREMITY ROM:     Active  Right eval Left eval  Hip flexion    Hip extension tight Tight   Hip abduction    Hip adduction    Hip internal rotation    Hip external rotation    Knee flexion    Knee extension    Ankle dorsiflexion    Ankle plantarflexion    Ankle inversion    Ankle eversion     (Blank rows = not tested)  LOWER EXTREMITY MMT:  WNL's throughout LE's - weak core    LUMBAR SPECIAL TESTS:  Straight leg raise test: Negative and Slump test: Negative    TODAY'S TREATMENT:                                                                                                                              [facility] Adult PT Treatment:                                                DATE: 10/30/22 Therapeutic Exercise: Nustep L7 x 5 min UE 9  Prone press up 2 sec x 10  4 part core 10 sec x 10 through exercises  Piriformis stretch 30 sec x 3  Antirotation green TB 3 sec hold x 10  Supine breathing + TA activation Single & double leg stretch (head down) Single leg circles CW/CCW x5 each Seated half roll down S/L/ leg kick series Figure 4 LTR x6 B Manual Therapy: Skilled palpation to assess response to DN and manual work  STM - Rt > Lt lumbar musculature into Rt posterior hip  Trigger Point Dry-Needling  Treatment instructions: Expect mild to moderate muscle soreness. S/S of pneumothorax if dry needled over a lung field, and to seek immediate medical attention should they occur. Patient verbalized understanding of these instructions and education. Patient Consent Given: Yes Education handout provided: Yes Muscles treated: Rt piriformis, glut med, glut max; QL, lumbar paraspinals  Electrical stimulation performed: Yes Parameters: mAmp current to pt tolerance  Treatment response/outcome: decreased  palpable tightness   Neuromuscular re-ed:  Therapeutic Activity: Education re-core strength and importance of gaining strength in core to improve [place] health Gait: WFL's - flexed forward at hips/trunk Modalities: Will use TENS and heat at home PRN; not currently using Self Care: Avoid sitting with legs crossed  Myofacial ball release work supine and standing piriformis  [facility] Adult PT Treatment:                                                DATE: 10/16/22 Therapeutic Exercise: Nustep L7 x 5 min UE 9  Prone press up 2 sec x 10  4 part core 10 sec x 10 through exercises  Piriformis stretch 30 sec x 3  Sit to stand hinged hip x 10  Row blue TB 3 sec hold x 10  Shoulder extension blue TB 3 sec hold x 10  Antirotation green TB 3 sec hold x 10  Manual Therapy: Skilled palpation to assess response to DN and manual work  STM - Rt > Lt lumbar musculature into Rt posterior hip  Trigger Point Dry-Needling  Treatment instructions: Expect mild to moderate muscle soreness. S/S of pneumothorax if dry needled over a lung field, and to seek immediate medical attention should they occur. Patient verbalized understanding of these instructions and education. Patient Consent Given: Yes Education handout provided: Yes Muscles treated: Rt piriformis, glut med, glut max; lumbar paraspinals  Electrical stimulation performed: Yes Parameters: mAmp current to pt tolerance  Treatment response/outcome: decreased palpable tightness   Neuromuscular re-ed:  Therapeutic Activity: Education re-core strength and importance of gaining strength in core to improve [place] health Gait: WFL's - flexed forward at hips/trunk Modalities: Will use TENS and heat at home Self Care: Avoid sitting with legs crossed  Myofacial ball release work supine and standing piriformis    PATIENT EDUCATION:   Education details: POC HEP Person educated: Patient Education method: Programmer, multimedia, Facilities manager, Actor cues, Verbal cues, and Handouts Education comprehension: verbalized understanding, returned demonstration, verbal cues required, tactile cues required, and needs further education  HOME EXERCISE PROGRAM: Access Code: [email or web address] URL: [email or web address] Date: 10/16/2022 Prepared by: Corlis Leak  Exercises - Prone Press Up  - 2 x daily - 7 x weekly - 1 sets - 10 reps - 2-3 sec  hold - Standing Lumbar Extension  - 2 x daily - 7 x weekly - 1 sets - 2-3 reps - 2-3 sec  hold - Supine Transversus Abdominis Bracing with Pelvic Floor Contraction  - 2 x daily - 7 x weekly - 1 sets - 10 reps - 10sec  hold - Supine Piriformis Stretch with Leg Straight  - 2 x daily - 7 x weekly - 1 sets - 3 reps - 30 sec  hold - Sit to Stand  - 2 x daily - 7 x weekly - 1 sets - 10 reps - 3-5 sec  hold - Standing Bilateral Low Shoulder Row with Anchored Resistance  - 2 x daily - 7 x weekly - 1-3 sets - 10 reps - 2-3 sec  hold - Shoulder extension with resistance - Neutral  - 1 x daily - 7 x weekly - 1-2 sets - 10 reps - 3-5 sec  hold - Anti-Rotation Lateral Stepping with Press  - 2 x daily - 7 x weekly - 1-2 sets - 10 reps - 2-3 sec  hold Patient Education - Office Posture  ASSESSMENT:  CLINICAL IMPRESSION: Patient reports continued improvement in LBP and Rt posterior hip pain. Patient has decreased palpable tightness in the posterior Rt hip and LB. Positive response to dry needling. Continued with core strengthening and stabilization.   OBJECTIVE IMPAIRMENTS: recurrent LBP Rt > Lt with sudden onset of pain ~  2 weeks ago with no known injury. She has a history of LBP with bulging disc in lumbar spine . She has limited trunk ROM; core weakness; abnormal gait with forward flexed posture; limited functional activity level and pain on a constant basis.decreased activity tolerance, decreased endurance,  decreased mobility, decreased ROM, decreased strength, hypomobility, increased fascial restrictions, impaired flexibility, improper body mechanics, postural dysfunction, and pain.    GOALS: Goals reviewed with patient? Yes  SHORT TERM GOALS: Target date: 11/06/2022   Independent in initial HEP  Baseline: Goal status: INITIAL  2.  Instruct in body mechanics and back care principles Baseline:  Goal status: INITIAL  LONG TERM GOALS: Target date: 12/04/2022  Decrease back pain by 75-100% Baseline:  Goal status: INITIAL  2.  Improve core strength and stability for patient to move sit to stand and stand to sit without pain Baseline:  Goal status: INITIAL  3.  Improve core strength and stability allowing patient to complete work day with no increase pain > 1-2/10  Baseline:  Goal status: INITIAL  4.  Independent in HEP  Baseline:  Goal status: INITIAL  5.  Improve functional limitation score to 69 Baseline: 48 Goal status: INITIAL  PLAN:  PT FREQUENCY: 1-2x/week  PT DURATION: 8 weeks  PLANNED INTERVENTIONS: Therapeutic exercises, Therapeutic activity, Neuromuscular re-education, Balance training, Gait training, Patient/Family education, Self Care, Joint mobilization, Aquatic Therapy, Dry Needling, Electrical stimulation, Cryotherapy, Moist heat, Ultrasound, Ionotophoresis 4mg /ml Dexamethasone, Manual therapy, and Re-evaluation.  PLAN FOR NEXT SESSION: review and progress HEP; continue back education; manual work, DN, modalities as indicated. Try hip flexor stretch and modified dead lift at next visit    Ramin Zoll Rober Minion, PT 10/30/2022, 8:04 AM

## 2022-11-06 ENCOUNTER — Encounter: Payer: 59 | Admitting: Rehabilitative and Restorative Service Providers"

## 2022-11-20 ENCOUNTER — Other Ambulatory Visit (HOSPITAL_BASED_OUTPATIENT_CLINIC_OR_DEPARTMENT_OTHER): Payer: Self-pay

## 2022-11-20 ENCOUNTER — Other Ambulatory Visit: Payer: Self-pay | Admitting: Physician Assistant

## 2022-11-20 MED ORDER — OZEMPIC (2 MG/DOSE) 8 MG/3ML ~~LOC~~ SOPN
2.0000 mg | PEN_INJECTOR | SUBCUTANEOUS | 1 refills | Status: DC
  Filled 2022-11-20: qty 3, 28d supply, fill #0
  Filled 2022-12-22: qty 9, 84d supply, fill #1

## 2022-11-21 ENCOUNTER — Other Ambulatory Visit (HOSPITAL_BASED_OUTPATIENT_CLINIC_OR_DEPARTMENT_OTHER): Payer: Self-pay

## 2022-12-22 ENCOUNTER — Other Ambulatory Visit (HOSPITAL_BASED_OUTPATIENT_CLINIC_OR_DEPARTMENT_OTHER): Payer: Self-pay

## 2023-01-08 ENCOUNTER — Other Ambulatory Visit: Payer: Self-pay | Admitting: Physician Assistant

## 2023-01-08 DIAGNOSIS — Z1231 Encounter for screening mammogram for malignant neoplasm of breast: Secondary | ICD-10-CM

## 2023-01-24 ENCOUNTER — Encounter: Payer: Self-pay | Admitting: Gastroenterology

## 2023-03-05 ENCOUNTER — Ambulatory Visit: Payer: 59

## 2023-03-06 ENCOUNTER — Ambulatory Visit
Admission: RE | Admit: 2023-03-06 | Discharge: 2023-03-06 | Disposition: A | Discharge status: Home / Self Care | Payer: 59 | Source: Ambulatory Visit | Attending: Physician Assistant | Admitting: Physician Assistant

## 2023-03-06 DIAGNOSIS — Z1231 Encounter for screening mammogram for malignant neoplasm of breast: Secondary | ICD-10-CM

## 2023-03-07 NOTE — Progress Notes (Signed)
Great news. Normal mammogram.

## 2023-04-23 ENCOUNTER — Ambulatory Visit (INDEPENDENT_AMBULATORY_CARE_PROVIDER_SITE_OTHER): Payer: 59 | Admitting: Physician Assistant

## 2023-04-23 ENCOUNTER — Encounter: Payer: Self-pay | Admitting: Physician Assistant

## 2023-04-23 ENCOUNTER — Other Ambulatory Visit (HOSPITAL_BASED_OUTPATIENT_CLINIC_OR_DEPARTMENT_OTHER): Payer: Self-pay

## 2023-04-23 DIAGNOSIS — L237 Allergic contact dermatitis due to plants, except food: Secondary | ICD-10-CM

## 2023-04-23 MED ORDER — CLOBETASOL PROPIONATE 0.05 % EX CREA
1.0000 | TOPICAL_CREAM | Freq: Two times a day (BID) | CUTANEOUS | 0 refills | Status: DC
Start: 2023-04-23 — End: 2024-04-23
  Filled 2023-04-23: qty 45, 30d supply, fill #0

## 2023-04-23 MED ORDER — METHYLPREDNISOLONE SODIUM SUCC 125 MG IJ SOLR
125.0000 mg | Freq: Once | INTRAMUSCULAR | Status: AC
Start: 2023-04-23 — End: 2023-04-23
  Administered 2023-04-23: 125 mg via INTRAMUSCULAR

## 2023-04-23 NOTE — Progress Notes (Signed)
   Acute Office Visit  Subjective:     Patient ID: Regina Mcconnell, female    DOB: [birth year], 52 y.o.   MRN: [id number]  Chief Complaint  Patient presents with   Poison Ivy    HPI Patient is in today for poison ivy rash and itching on arms and legs. She gets this from time to time after working in her yard. She has had this rash for last 2 days. Seems to be spreading some. She is using clorox to help with itching.   .. Active Ambulatory Problems    Diagnosis Date Noted   Family history of early CAD 07/23/2012   Family history of diabetes mellitus 07/23/2012   Heart murmur 07/23/2012   Poison ivy dermatitis 01/02/2013   Elevated liver enzymes 05/23/2017   Pain of upper abdomen 05/23/2017   Spleen enlarged 05/23/2017   Pulmonary nodule 06/19/2017   Class 3 severe obesity due to excess calories without serious comorbidity with body mass index (BMI) of 40.0 to 44.9 in adult (HCC) 02/05/2018   B12 nutritional deficiency 02/06/2018   Morbid obesity (HCC) 01/01/2019   Symptomatic mammary hypertrophy 01/01/2019   Incomplete uterovaginal prolapse 04/01/2019   Stress incontinence 04/01/2019   Vitamin D insufficiency 04/30/2019   Hyperlipidemia LDL goal <130 05/01/2019   Elevated fasting glucose 05/01/2019   Lumbar spinal stenosis 10/27/2019   S/P bilateral breast reduction 03/26/2020   Elevated LDL cholesterol level 02/24/2021   Left lower quadrant pain 06/14/2021   Nausea 06/14/2021   Uterovaginal prolapse, incomplete 09/12/2021   Class 1 obesity due to excess calories without serious comorbidity with body mass index (BMI) of 33.0 to 33.9 in adult 04/17/2022   Elevated serum creatinine 04/18/2022   Adenomatous colon polyp 05/08/2022   Resolved Ambulatory Problems    Diagnosis Date Noted   Acute low back pain with disc symptoms, duration less than 6 weeks 06/03/2012   Past Medical History:  Diagnosis Date   Abnormal Pap smear of cervix 1995   Allergy    Blood transfusion  without reported diagnosis    History of COVID-19 03/2021   Pneumonia    PONV (postoperative nausea and vomiting)    Urinary incontinence    Wears glasses      ROS See HPI.      Objective:    LMP  (LMP Unknown)  BP Readings from Last 3 Encounters:  04/27/22 102/62  04/17/22 119/67  10/25/21 132/81   Wt Readings from Last 3 Encounters:  04/27/22 194 lb (88 kg)  04/17/22 196 lb (88.9 kg)  03/30/22 194 lb 6.4 oz (88.2 kg)      Physical Exam Linear erythematous vesicular rash of bilateral upper and lower extremities diffuse and scattered.       Assessment & Plan:  Marland KitchenMarland KitchenIndya was seen today for poison ivy.  Diagnoses and all orders for this visit:  Poison ivy dermatitis -     clobetasol cream (TEMOVATE) 0.05 %; Apply 1 Application topically 2 (two) times daily. For rash and itching. -     methylPREDNISolone sodium succinate (SOLU-MEDROL) 125 mg/2 mL injection 125 mg   Solumedrol 125mg  IM and clobetasol for topical use.  Cool compresses and benadryl if needed for itching Follow up as needed if symptoms persist or worsen.    Tandy Gaw, PA-C

## 2023-12-19 ENCOUNTER — Other Ambulatory Visit (HOSPITAL_BASED_OUTPATIENT_CLINIC_OR_DEPARTMENT_OTHER): Payer: Self-pay

## 2024-02-27 ENCOUNTER — Other Ambulatory Visit: Payer: Self-pay | Admitting: Physician Assistant

## 2024-02-27 DIAGNOSIS — Z1231 Encounter for screening mammogram for malignant neoplasm of breast: Secondary | ICD-10-CM

## 2024-02-28 ENCOUNTER — Other Ambulatory Visit: Payer: Self-pay

## 2024-02-28 ENCOUNTER — Other Ambulatory Visit: Payer: Self-pay | Admitting: Medical Genetics

## 2024-02-28 MED ORDER — VALACYCLOVIR HCL 1 G PO TABS: 2000.0000 mg | ORAL_TABLET | Freq: Two times a day (BID) | ORAL | 1 refills | Status: AC

## 2024-03-06 ENCOUNTER — Ambulatory Visit
Admission: RE | Admit: 2024-03-06 | Discharge: 2024-03-06 | Disposition: A | Discharge status: Home / Self Care | Payer: Self-pay | Source: Ambulatory Visit | Attending: Physician Assistant | Admitting: Physician Assistant

## 2024-03-06 DIAGNOSIS — Z1231 Encounter for screening mammogram for malignant neoplasm of breast: Secondary | ICD-10-CM

## 2024-03-11 ENCOUNTER — Ambulatory Visit: Payer: Self-pay | Admitting: Physician Assistant

## 2024-03-11 NOTE — Progress Notes (Signed)
 Normal mammogram. Follow up in 1 year.

## 2024-04-23 ENCOUNTER — Encounter: Payer: Self-pay | Admitting: Physician Assistant

## 2024-04-23 ENCOUNTER — Ambulatory Visit (INDEPENDENT_AMBULATORY_CARE_PROVIDER_SITE_OTHER): Admitting: Physician Assistant

## 2024-04-23 VITALS — BP 131/73 | HR 70 | Ht 64.0 in | Wt 232.0 lb

## 2024-04-23 DIAGNOSIS — Z0000 Encounter for general adult medical examination without abnormal findings: Secondary | ICD-10-CM | POA: Diagnosis not present

## 2024-04-23 DIAGNOSIS — Z6839 Body mass index (BMI) 39.0-39.9, adult: Secondary | ICD-10-CM

## 2024-04-23 DIAGNOSIS — Z13220 Encounter for screening for lipoid disorders: Secondary | ICD-10-CM | POA: Diagnosis not present

## 2024-04-23 DIAGNOSIS — E559 Vitamin D deficiency, unspecified: Secondary | ICD-10-CM | POA: Diagnosis not present

## 2024-04-23 DIAGNOSIS — E785 Hyperlipidemia, unspecified: Secondary | ICD-10-CM | POA: Diagnosis not present

## 2024-04-23 DIAGNOSIS — E6609 Other obesity due to excess calories: Secondary | ICD-10-CM

## 2024-04-23 DIAGNOSIS — Z131 Encounter for screening for diabetes mellitus: Secondary | ICD-10-CM

## 2024-04-23 DIAGNOSIS — E66812 Other obesity due to excess calories: Secondary | ICD-10-CM

## 2024-04-23 NOTE — Progress Notes (Signed)
 Complete physical exam  Patient: Regina Mcconnell   DOB: 1970-12-21   53 y.o. Female  MRN: [id number]  Subjective:    Chief Complaint  Patient presents with   Annual Exam    Regina Mcconnell is a 53 y.o. female who presents today for a complete physical exam. She reports consuming a general diet. The patient does not participate in regular exercise at present. She generally feels well. She reports sleeping well. She does not have additional problems to discuss today.    Most recent fall risk assessment:    04/23/2024   11:57 AM  Fall Risk   Falls in the past year? 0  Number falls in past yr: 0  Injury with Fall? 0  Risk for fall due to : No Fall Risks     Most recent depression screenings:    04/23/2024   11:57 AM 04/23/2024    9:38 AM  PHQ 2/9 Scores  PHQ - 2 Score 0 0    Vision:Within last year and Dental: No current dental problems  Patient Active Problem List   Diagnosis Date Noted   Adenomatous colon polyp 05/08/2022   Elevated serum creatinine 04/18/2022   Class 2 obesity due to excess calories without serious comorbidity with body mass index (BMI) of 39.0 to 39.9 in adult 04/17/2022   Uterovaginal prolapse, incomplete 09/12/2021   Left lower quadrant pain 06/14/2021   Nausea 06/14/2021   Elevated LDL cholesterol level 02/24/2021   S/P bilateral breast reduction 03/26/2020   Lumbar spinal stenosis 10/27/2019   Hyperlipidemia LDL goal <130 05/01/2019   Elevated fasting glucose 05/01/2019   Vitamin D  insufficiency 04/30/2019   Incomplete uterovaginal prolapse 04/01/2019   Stress incontinence 04/01/2019   Morbid obesity (HCC) 01/01/2019   Symptomatic mammary hypertrophy 01/01/2019   B12 nutritional deficiency 02/06/2018   Class 3 severe obesity due to excess calories without serious comorbidity with body mass index (BMI) of 40.0 to 44.9 in adult 02/05/2018   Pulmonary nodule 06/19/2017   Elevated liver enzymes 05/23/2017   Pain of upper abdomen 05/23/2017   Spleen  enlarged 05/23/2017   Poison ivy dermatitis 01/02/2013   Family history of early CAD 07/23/2012   Family history of diabetes mellitus 07/23/2012   Heart murmur 07/23/2012      Patient Care Team: Clete Kuch L, PA-C as PCP - General (Family Medicine) Cathlyn JAYSON Nikki Bobie FORBES, MD as Attending Physician (Obstetrics and Gynecology)   Outpatient Medications Prior to Visit  Medication Sig   valACYclovir  (VALTREX ) 1000 MG tablet Take 2 tablets (2,000 mg total) by mouth 2 (two) times daily. For one day for outbreaks.   [DISCONTINUED] clobetasol  cream (TEMOVATE ) 0.05 % Apply 1 Application topically 2 (two) times daily. For rash and itching.   [DISCONTINUED] influenza vac split quadrivalent PF (FLUARIX) 0.5 ML injection Inject into the muscle.   [DISCONTINUED] Semaglutide , 2 MG/DOSE, (OZEMPIC , 2 MG/DOSE,) 8 MG/3ML SOPN Inject 2 mg into the skin once a week.   No facility-administered medications prior to visit.    Review of Systems  All other systems reviewed and are negative.         Objective:     BP 131/73   Pulse 70   Ht 5' 4 (1.626 m)   Wt 232 lb (105.2 kg)   LMP  (LMP Unknown)   SpO2 99%   BMI 39.82 kg/m  BP Readings from Last 3 Encounters:  04/23/24 131/73  04/27/22 102/62  04/17/22 119/67   Wt Readings  from Last 3 Encounters:  04/23/24 232 lb (105.2 kg)  04/27/22 194 lb (88 kg)  04/17/22 196 lb (88.9 kg)      Physical Exam  BP 131/73   Pulse 70   Ht 5' 4 (1.626 m)   Wt 232 lb (105.2 kg)   LMP  (LMP Unknown)   SpO2 99%   BMI 39.82 kg/m   General Appearance:    Alert, cooperative, obese no distress, appears stated age  Head:    Normocephalic, without obvious abnormality, atraumatic  Eyes:    PERRL, conjunctiva/corneas clear, EOM's intact, fundi    benign, both eyes  Ears:    Normal TM's and external ear canals, both ears  Nose:   Nares normal, septum midline, mucosa normal, no drainage    or sinus tenderness  Throat:   Lips, mucosa, and tongue  normal; teeth and gums normal  Neck:   Supple, symmetrical, trachea midline, no adenopathy;    thyroid :  no enlargement/tenderness/nodules; no carotid   bruit or JVD  Back:     Symmetric, no curvature, ROM normal, no CVA tenderness  Lungs:     Clear to auscultation bilaterally, respirations unlabored  Chest Wall:    No tenderness or deformity   Heart:    Regular rate and rhythm, S1 and S2 normal, no murmur, rub   or gallop     Abdomen:     Soft, non-tender, bowel sounds active all four quadrants,    no masses, no organomegaly        Extremities:   Extremities normal, atraumatic, no cyanosis or edema  Pulses:   2+ and symmetric all extremities  Skin:   Skin color, texture, turgor normal, no rashes or lesions  Lymph nodes:   Cervical, supraclavicular, and axillary nodes normal  Neurologic:   CNII-XII intact, normal strength, sensation and reflexes    throughout      Assessment & Plan:    Routine Health Maintenance and Physical Exam  Immunization History  Administered Date(s) Administered   Influenza Split 07/18/2012   Influenza,inj,Quad PF,6+ Mos 05/28/2020, 06/23/2022   Influenza-Unspecified 06/14/2018, 06/20/2019   Tdap 02/09/2012, 04/17/2022    Health Maintenance  Topic Date Due   COVID-19 Vaccine (1 - 2024-25 season) 05/09/2024 (Originally 04/29/2023)   Zoster Vaccines- Shingrix (1 of 2) 07/24/2024 (Originally 05/08/2021)   INFLUENZA VACCINE  11/25/2024 (Originally 03/28/2024)   Pneumococcal Vaccine: 50+ Years (1 of 1 - PCV) 04/23/2025 (Originally 05/08/2021)   Hepatitis B Vaccines 19-59 Average Risk (1 of 3 - 19+ 3-dose series) 04/23/2025 (Originally 05/08/1990)   Colonoscopy  04/23/2025 (Originally 04/28/2023)   HIV Screening  04/23/2025 (Originally 05/08/1986)   MAMMOGRAM  03/06/2025   DTaP/Tdap/Td (3 - Td or Tdap) 04/17/2032   Hepatitis C Screening  Completed   HPV VACCINES  Aged Out   Meningococcal B Vaccine  Aged Out    Discussed health benefits of physical activity,  and encouraged her to engage in regular exercise appropriate for her age and condition.  Regina Mcconnell was seen today for annual exam.  Diagnoses and all orders for this visit:  Encounter for annual physical exam -     CBC with Differential/Platelet -     CMP14+EGFR -     Lipid panel -     VITAMIN D  25 Hydroxy (Vit-D Deficiency, Fractures) -     B12 and Folate Panel -     TSH + free T4  Screening for diabetes mellitus -     CMP14+EGFR  Screening  for lipid disorders -     Lipid panel  Vitamin D  insufficiency -     VITAMIN D  25 Hydroxy (Vit-D Deficiency, Fractures)  Hyperlipidemia LDL goal <130 -     Lipid panel  Class 2 obesity due to excess calories without serious comorbidity with body mass index (BMI) of 39.0 to 39.9 in adult   .Regina Mcconnell Discussed 150 minutes of exercise a week.  Encouraged vitamin D  1000 units and Calcium 1300mg  or 4 servings of dairy a day.  Fasting labs ordered today  PHQ, no concerns Mammogram UTD Pap not indicated due to hysterectomy  Pt declined colonoscopy referral due to cost. 2023 last colonoscopy and found benign polyp and suggested 1 year follow up. Pt states she will consider another colonoscopy next year.   Pt declined pneumonia/covid/shingles vaccines. She will get flu vaccine at work.   Regina Mcconnell.Discussed low carb diet with 1500 calories and 80g of protein.  Exercising at least 150 minutes a week.  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  Discussed contrave /zepbound Pt will consider     Jahzeel Poythress, PA-C

## 2024-04-23 NOTE — Patient Instructions (Signed)

## 2024-04-24 LAB — TSH+FREE T4
Free T4: 0.99 ng/dL (ref 0.82–1.77)
TSH: 3.88 u[IU]/mL (ref 0.450–4.500)

## 2024-04-24 LAB — CMP14+EGFR
ALT: 19 IU/L (ref 0–32)
AST: 19 IU/L (ref 0–40)
Albumin: 4.4 g/dL (ref 3.8–4.9)
Alkaline Phosphatase: 107 IU/L (ref 44–121)
BUN/Creatinine Ratio: 19 (ref 9–23)
BUN: 19 mg/dL (ref 6–24)
Bilirubin Total: 0.3 mg/dL (ref 0.0–1.2)
CO2: 18 mmol/L — ABNORMAL LOW (ref 20–29)
Calcium: 9.5 mg/dL (ref 8.7–10.2)
Chloride: 103 mmol/L (ref 96–106)
Creatinine, Ser: 1 mg/dL (ref 0.57–1.00)
Globulin, Total: 2.3 g/dL (ref 1.5–4.5)
Glucose: 95 mg/dL (ref 70–99)
Potassium: 4.9 mmol/L (ref 3.5–5.2)
Sodium: 138 mmol/L (ref 134–144)
Total Protein: 6.7 g/dL (ref 6.0–8.5)
eGFR: 68 mL/min/1.73 (ref >59)

## 2024-04-24 LAB — CBC WITH DIFFERENTIAL/PLATELET
Basophils Absolute: 0.1 x10E3/uL (ref 0.0–0.2)
Basos: 1 %
EOS (ABSOLUTE): 0.2 x10E3/uL (ref 0.0–0.4)
Eos: 4 %
Hematocrit: 41.1 % (ref 34.0–46.6)
Hemoglobin: 13.7 g/dL (ref 11.1–15.9)
Immature Grans (Abs): 0 x10E3/uL (ref 0.0–0.1)
Immature Granulocytes: 0 %
Lymphocytes Absolute: 2.1 x10E3/uL (ref 0.7–3.1)
Lymphs: 40 %
MCH: 30.4 pg (ref 26.6–33.0)
MCHC: 33.3 g/dL (ref 31.5–35.7)
MCV: 91 fL (ref 79–97)
Monocytes Absolute: 0.3 x10E3/uL (ref 0.1–0.9)
Monocytes: 6 %
Neutrophils Absolute: 2.6 x10E3/uL (ref 1.4–7.0)
Neutrophils: 48 %
Platelets: 218 x10E3/uL (ref 150–450)
RBC: 4.5 x10E6/uL (ref 3.77–5.28)
RDW: 13.1 % (ref 11.7–15.4)
WBC: 5.3 x10E3/uL (ref 3.4–10.8)

## 2024-04-24 LAB — B12 AND FOLATE PANEL
Folate: 5.6 ng/mL (ref >3.0)
Vitamin B-12: 245 pg/mL (ref 232–1245)

## 2024-04-24 LAB — LIPID PANEL
Chol/HDL Ratio: 4.7 ratio — ABNORMAL HIGH (ref 0.0–4.4)
Cholesterol, Total: 252 mg/dL — ABNORMAL HIGH (ref 100–199)
HDL: 54 mg/dL (ref >39)
LDL Chol Calc (NIH): 181 mg/dL — ABNORMAL HIGH (ref 0–99)
Triglycerides: 97 mg/dL (ref 0–149)
VLDL Cholesterol Cal: 17 mg/dL (ref 5–40)

## 2024-04-24 LAB — VITAMIN D 25 HYDROXY (VIT D DEFICIENCY, FRACTURES): Vit D, 25-Hydroxy: 33.6 ng/mL (ref 30.0–100.0)

## 2024-04-25 ENCOUNTER — Ambulatory Visit: Payer: Self-pay | Admitting: Physician Assistant

## 2024-04-25 NOTE — Progress Notes (Signed)
 Kassadi,   Thyroid  normal range and seems to be stable from 2 years ago.  B12 low normal. I would start b12 1000mcg daily.  Vitamin D  low normal. Make sure taking at least 1000 units with K2 for better absorption daily.   We could check vitamin D  and B12 in 3 months to make sure dosing is right.   Kidney and liver function good. Glucose normal.   Your bad cholesterol is very high but your overall 10 year is still low. Due to 181 for LDL I would recommend starting a cholesterol medication for prevention or at least getting a coronary calcium CT to further assess cardiovascular risk. Of course diet and exercise changes can help.   SABRA.The 10-year ASCVD risk score (Arnett DK, et al., 2019) is: 2.1%   Values used to calculate the score:     Age: 53 years     Clincally relevant sex: Female     Is Non-Hispanic African American: No     Diabetic: No     Tobacco smoker: No     Systolic Blood Pressure: 131 mmHg     Is BP treated: No     HDL Cholesterol: 54 mg/dL     Total Cholesterol: 252 mg/dL

## 2024-04-29 ENCOUNTER — Encounter: Payer: Self-pay | Admitting: Sports Medicine

## 2024-05-30 ENCOUNTER — Other Ambulatory Visit
Admission: RE | Admit: 2024-05-30 | Discharge: 2024-05-30 | Disposition: A | Discharge status: Home / Self Care | Payer: Self-pay | Source: Ambulatory Visit | Attending: Medical Genetics | Admitting: Medical Genetics

## 2024-06-09 LAB — GENECONNECT MOLECULAR SCREEN: Genetic Analysis Overall Interpretation: NEGATIVE

## 2024-06-24 ENCOUNTER — Other Ambulatory Visit (HOSPITAL_BASED_OUTPATIENT_CLINIC_OR_DEPARTMENT_OTHER): Payer: Self-pay

## 2024-06-24 MED ORDER — FLUZONE 0.5 ML IM SUSY
0.5000 mL | PREFILLED_SYRINGE | Freq: Once | INTRAMUSCULAR | 0 refills | Status: AC
  Filled 2024-06-24: qty 0.5, 1d supply, fill #0

## 2024-10-01 ENCOUNTER — Encounter: Payer: Self-pay | Admitting: Physician Assistant

## 2024-10-01 ENCOUNTER — Ambulatory Visit: Admitting: Physician Assistant

## 2024-10-01 ENCOUNTER — Other Ambulatory Visit (HOSPITAL_BASED_OUTPATIENT_CLINIC_OR_DEPARTMENT_OTHER): Payer: Self-pay

## 2024-10-01 VITALS — BP 135/70 | HR 87 | Temp 98.0°F

## 2024-10-01 DIAGNOSIS — J329 Chronic sinusitis, unspecified: Secondary | ICD-10-CM | POA: Diagnosis not present

## 2024-10-01 DIAGNOSIS — J40 Bronchitis, not specified as acute or chronic: Secondary | ICD-10-CM | POA: Diagnosis not present

## 2024-10-01 MED ORDER — HYDROCODONE BIT-HOMATROP MBR 5-1.5 MG/5ML PO SOLN
5.0000 mL | Freq: Three times a day (TID) | ORAL | 0 refills | Status: AC | PRN
  Filled 2024-10-01: qty 120, 8d supply, fill #0

## 2024-10-01 MED ORDER — AMOXICILLIN 875 MG PO TABS
875.0000 mg | ORAL_TABLET | Freq: Two times a day (BID) | ORAL | 0 refills | Status: AC
  Filled 2024-10-01: qty 14, 7d supply, fill #0

## 2024-10-01 MED ORDER — METHYLPREDNISOLONE SODIUM SUCC 125 MG IJ SOLR
125.0000 mg | Freq: Once | INTRAMUSCULAR | Status: AC
  Administered 2024-10-01: 125 mg via INTRAMUSCULAR

## 2024-10-01 MED ORDER — BENZONATATE 200 MG PO CAPS
200.0000 mg | ORAL_CAPSULE | Freq: Three times a day (TID) | ORAL | 0 refills | Status: AC | PRN
  Filled 2024-10-01: qty 30, 10d supply, fill #0

## 2024-10-01 NOTE — Patient Instructions (Signed)
 Hycodan for cough at night.  Tessalon  during the day cough.  Solumedrol to help with inflammation.  Amoxil  for 7 days for any infection.   Acute Bronchitis, Adult  Acute bronchitis is when air tubes in the lungs (bronchi) suddenly get swollen. The condition can make it hard for you to breathe. In adults, acute bronchitis usually goes away within 2 weeks. A cough caused by bronchitis may last up to 3 weeks. Smoking, allergies, and asthma can make the condition worse. What are the causes? Germs that cause cold and flu (viruses). The most common cause of this condition is the virus that causes the common cold. Bacteria. Substances that bother (irritate) the lungs, including: Smoke from cigarettes and other types of tobacco. Dust and pollen. Fumes from chemicals, gases, or burned fuel. Indoor or outdoor air pollution. What increases the risk? A weak body's defense system. This is also called the immune system. Any condition that affects your lungs and breathing, such as asthma. What are the signs or symptoms? A cough. Coughing up clear, yellow, or green mucus. Making high-pitched whistling sounds when you breathe, most often when you breathe out (wheezing). Runny or stuffy nose. Having too much mucus in your lungs (chest congestion). Shortness of breath. Body aches. A sore throat. How is this treated? Acute bronchitis may go away over time without treatment. Your doctor may tell you to: Drink more fluids. This will help thin your mucus so it is easier to cough up. Use a device that gets medicine into your lungs (inhaler). Use a vaporizer or a humidifier. These are machines that add water to the air. This helps with coughing and poor breathing. Take a medicine that thins mucus and helps clear it from your lungs. Take a medicine that prevents or stops coughing. It is not common to take an antibiotic medicine for this condition. Follow these instructions at home:  Take over-the-counter  and prescription medicines only as told by your doctor. Use an inhaler, vaporizer, or humidifier as told by your doctor. Take two teaspoons (10 mL) of honey at bedtime. This helps lessen your coughing at night. Drink enough fluid to keep your pee (urine) pale yellow. Do not smoke or use any products that contain nicotine or tobacco. If you need help quitting, ask your doctor. Get a lot of rest. Return to your normal activities when your doctor says that it is safe. Keep all follow-up visits. How is this prevented?  Wash your hands often with soap and water for at least 20 seconds. If you cannot use soap and water, use hand sanitizer. Avoid contact with people who have cold symptoms. Try not to touch your mouth, nose, or eyes with your hands. Avoid breathing in smoke or chemical fumes. Make sure to get the flu shot every year. Contact a doctor if: Your symptoms do not get better in 2 weeks. You have trouble coughing up the mucus. Your cough keeps you awake at night. You have a fever. Get help right away if: You cough up blood. You have chest pain. You have very bad shortness of breath. You faint or keep feeling like you are going to faint. You have a very bad headache. Your fever or chills get worse. These symptoms may be an emergency. Get help right away. Call your local emergency services (911 in the [place] Do not wait to see if the symptoms will go away. Do not drive yourself to the hospital. Summary Acute bronchitis is when air tubes in the lungs (  bronchi) suddenly get swollen. In adults, acute bronchitis usually goes away within 2 weeks. Drink more fluids. This will help thin your mucus so it is easier to cough up. Take over-the-counter and prescription medicines only as told by your doctor. Contact a doctor if your symptoms do not improve after 2 weeks of treatment. This information is not intended to replace advice given to you by your health care provider. Make sure you  discuss any questions you have with your health care provider. Document Revised: 12/15/2020 Document Reviewed: 12/15/2020 Elsevier Patient Education  2024 [employer].

## 2024-10-01 NOTE — Progress Notes (Signed)
 "  Acute Office Visit  Subjective:     Patient ID: Regina Mcconnell, female    DOB: 1970/10/16, 54 y.o.   MRN: [id number]  Chief Complaint  Patient presents with   Cough    HPI .Discussed the use of AI scribe software for clinical note transcription with the patient, who gave verbal consent to proceed.  History of Present Illness Regina Mcconnell is a 54 year old female who presents with a persistent cough for the last two to three weeks.  Cough - Persistent for the last 2 to 3 weeks - Frequently triggered by talking - Severe enough to disrupt sleep - Initially accompanied by low-grade fever, which has resolved - Temporary loss of voice for a few days, now improved  Sinus symptoms - Significant sinus drainage contributing to cough - Sinus pressure present - No ear pain, sore throat, or headache  Respiratory symptoms - No shortness of breath  Symptom management - Using over-the-counter cough drops, Tylenol , and ibuprofen      ROS See HPI.      Objective:    BP 135/70   Pulse 87   Temp 98 F (36.7 C) (Oral)   LMP  (LMP Unknown)   SpO2 99%  BP Readings from Last 3 Encounters:  10/01/24 135/70  04/23/24 131/73  04/27/22 102/62   Wt Readings from Last 3 Encounters:  04/23/24 232 lb (105.2 kg)  04/27/22 194 lb (88 kg)  04/17/22 196 lb (88.9 kg)      Physical Exam Constitutional:      Appearance: Normal appearance. She is obese.  HENT:     Head: Normocephalic.     Right Ear: Tympanic membrane, ear canal and external ear normal. There is no impacted cerumen.     Left Ear: Tympanic membrane and external ear normal. There is no impacted cerumen.     Nose: Congestion present. No rhinorrhea.     Mouth/Throat:     Mouth: Mucous membranes are moist.     Pharynx: Posterior oropharyngeal erythema present. No oropharyngeal exudate.  Eyes:     Conjunctiva/sclera: Conjunctivae normal.  Cardiovascular:     Rate and Rhythm: Normal rate.  Pulmonary:     Effort:  Pulmonary effort is normal.     Breath sounds: Normal breath sounds. No wheezing or rhonchi.     Comments: Dry cough and throat clearing continuously.  Musculoskeletal:     Cervical back: Normal range of motion and neck supple. No tenderness.  Lymphadenopathy:     Cervical: No cervical adenopathy.  Neurological:     General: No focal deficit present.     Mental Status: She is alert and oriented to person, place, and time.  Psychiatric:        Mood and Affect: Mood normal.          Assessment & Plan:  .Regina Mcconnell was seen today for cough.  Diagnoses and all orders for this visit:  Sinobronchitis -     benzonatate  (TESSALON ) 200 MG capsule; Take 1 capsule (200 mg total) by mouth 3 (three) times daily as needed. -     HYDROcodone  bit-homatropine (HYCODAN) 5-1.5 MG/5ML syrup; Take 5 mLs by mouth every 8 (eight) hours as needed for cough. -     amoxicillin  (AMOXIL ) 875 MG tablet; Take 1 tablet (875 mg total) by mouth 2 (two) times daily for 7 days. 7 days. -     methylPREDNISolone  sodium succinate (SOLU-MEDROL ) 125 mg/2 mL injection 125 mg    Assessment &  Plan Sinobronchitis Persistent cough with sinus drainage and low-grade fever suggests secondary bacterial infection. - Administered Solu-Medrol  125 mg injection. - Prescribed hycodan cough syrup for nighttime. - Prescribed Tessalon  Perles for daytime cough, warned of sedation. - Prescribed amoxicillin  for sinus drainage.    Jadyn Barge, PA-C   "
